# Patient Record
Sex: Female | Born: 1950 | Race: White | Hispanic: No | Smoking: Former smoker
Health system: Southern US, Community
[De-identification: ages and names within clinical notes are randomized; demographics above are authoritative.]

## PROBLEM LIST (undated history)

## (undated) DIAGNOSIS — G8929 Other chronic pain: Secondary | ICD-10-CM

## (undated) DIAGNOSIS — Z8619 Personal history of other infectious and parasitic diseases: Secondary | ICD-10-CM

## (undated) DIAGNOSIS — Z87440 Personal history of urinary (tract) infections: Secondary | ICD-10-CM

## (undated) DIAGNOSIS — F329 Major depressive disorder, single episode, unspecified: Secondary | ICD-10-CM

## (undated) DIAGNOSIS — E119 Type 2 diabetes mellitus without complications: Secondary | ICD-10-CM

## (undated) DIAGNOSIS — G4730 Sleep apnea, unspecified: Secondary | ICD-10-CM

## (undated) DIAGNOSIS — Z9289 Personal history of other medical treatment: Secondary | ICD-10-CM

## (undated) DIAGNOSIS — D649 Anemia, unspecified: Secondary | ICD-10-CM

## (undated) DIAGNOSIS — K219 Gastro-esophageal reflux disease without esophagitis: Secondary | ICD-10-CM

## (undated) DIAGNOSIS — F32A Depression, unspecified: Secondary | ICD-10-CM

## (undated) DIAGNOSIS — I10 Essential (primary) hypertension: Secondary | ICD-10-CM

## (undated) DIAGNOSIS — T7840XA Allergy, unspecified, initial encounter: Secondary | ICD-10-CM

## (undated) DIAGNOSIS — M1990 Unspecified osteoarthritis, unspecified site: Secondary | ICD-10-CM

## (undated) DIAGNOSIS — K760 Fatty (change of) liver, not elsewhere classified: Secondary | ICD-10-CM

## (undated) HISTORY — DX: Essential (primary) hypertension: I10

## (undated) HISTORY — DX: Unspecified osteoarthritis, unspecified site: M19.90

## (undated) HISTORY — DX: Other chronic pain: G89.29

## (undated) HISTORY — DX: Anemia, unspecified: D64.9

## (undated) HISTORY — DX: Type 2 diabetes mellitus without complications: E11.9

## (undated) HISTORY — DX: Personal history of other medical treatment: Z92.89

## (undated) HISTORY — DX: Major depressive disorder, single episode, unspecified: F32.9

## (undated) HISTORY — DX: Personal history of other infectious and parasitic diseases: Z86.19

## (undated) HISTORY — DX: Depression, unspecified: F32.A

## (undated) HISTORY — DX: Gastro-esophageal reflux disease without esophagitis: K21.9

## (undated) HISTORY — DX: Personal history of urinary (tract) infections: Z87.440

## (undated) HISTORY — DX: Sleep apnea, unspecified: G47.30

## (undated) HISTORY — DX: Allergy, unspecified, initial encounter: T78.40XA

## (undated) HISTORY — DX: Fatty (change of) liver, not elsewhere classified: K76.0

---

## 2001-09-26 HISTORY — PX: TOTAL HIP ARTHROPLASTY: SHX124

## 2006-01-24 ENCOUNTER — Ambulatory Visit: Payer: Self-pay | Admitting: Internal Medicine

## 2006-03-24 ENCOUNTER — Ambulatory Visit: Payer: Self-pay | Admitting: Internal Medicine

## 2007-12-06 ENCOUNTER — Ambulatory Visit: Payer: Self-pay | Admitting: Internal Medicine

## 2008-10-01 ENCOUNTER — Ambulatory Visit: Payer: Self-pay | Admitting: Internal Medicine

## 2009-02-02 ENCOUNTER — Ambulatory Visit: Payer: Self-pay | Admitting: Internal Medicine

## 2009-02-05 ENCOUNTER — Ambulatory Visit: Payer: Self-pay | Admitting: Pain Medicine

## 2009-02-11 ENCOUNTER — Ambulatory Visit: Payer: Self-pay | Admitting: Pain Medicine

## 2009-03-10 ENCOUNTER — Ambulatory Visit: Payer: Self-pay | Admitting: Pain Medicine

## 2009-04-21 ENCOUNTER — Ambulatory Visit: Payer: Self-pay | Admitting: Pain Medicine

## 2009-04-29 ENCOUNTER — Ambulatory Visit: Payer: Self-pay | Admitting: Pain Medicine

## 2009-11-04 ENCOUNTER — Emergency Department: Payer: Self-pay | Admitting: Emergency Medicine

## 2010-06-29 ENCOUNTER — Ambulatory Visit: Payer: Self-pay | Admitting: Internal Medicine

## 2010-08-10 ENCOUNTER — Ambulatory Visit: Payer: Self-pay | Admitting: Internal Medicine

## 2010-10-21 LAB — HM PAP SMEAR: HM Pap smear: NORMAL

## 2010-10-21 LAB — HM MAMMOGRAPHY: HM MAMMO: NORMAL

## 2011-03-28 ENCOUNTER — Ambulatory Visit: Payer: Self-pay | Admitting: Internal Medicine

## 2012-01-27 ENCOUNTER — Ambulatory Visit: Payer: Self-pay | Admitting: Internal Medicine

## 2012-02-25 ENCOUNTER — Ambulatory Visit: Payer: Self-pay | Admitting: Internal Medicine

## 2012-03-26 ENCOUNTER — Ambulatory Visit: Payer: Self-pay | Admitting: Internal Medicine

## 2012-04-26 ENCOUNTER — Ambulatory Visit: Payer: Self-pay | Admitting: Internal Medicine

## 2012-05-03 ENCOUNTER — Ambulatory Visit: Payer: Self-pay | Admitting: Internal Medicine

## 2012-05-27 ENCOUNTER — Ambulatory Visit: Payer: Self-pay | Admitting: Internal Medicine

## 2012-11-13 ENCOUNTER — Ambulatory Visit: Payer: Self-pay | Admitting: Internal Medicine

## 2013-03-08 ENCOUNTER — Emergency Department: Payer: Self-pay | Admitting: Emergency Medicine

## 2013-05-28 ENCOUNTER — Ambulatory Visit: Payer: Self-pay | Admitting: Adult Health

## 2013-09-02 LAB — HM DIABETES EYE EXAM

## 2013-09-10 ENCOUNTER — Ambulatory Visit: Payer: Self-pay | Admitting: Internal Medicine

## 2014-01-10 ENCOUNTER — Encounter: Payer: Self-pay | Admitting: Adult Health

## 2014-01-10 ENCOUNTER — Encounter (INDEPENDENT_AMBULATORY_CARE_PROVIDER_SITE_OTHER): Payer: Self-pay

## 2014-01-10 ENCOUNTER — Ambulatory Visit (INDEPENDENT_AMBULATORY_CARE_PROVIDER_SITE_OTHER): Payer: Medicare PPO | Admitting: Adult Health

## 2014-01-10 VITALS — BP 148/84 | HR 94 | Temp 99.1°F | Resp 14 | Ht 65.3 in

## 2014-01-10 DIAGNOSIS — IMO0002 Reserved for concepts with insufficient information to code with codable children: Secondary | ICD-10-CM

## 2014-01-10 DIAGNOSIS — G8929 Other chronic pain: Secondary | ICD-10-CM

## 2014-01-10 DIAGNOSIS — Z1239 Encounter for other screening for malignant neoplasm of breast: Secondary | ICD-10-CM

## 2014-01-10 DIAGNOSIS — IMO0001 Reserved for inherently not codable concepts without codable children: Secondary | ICD-10-CM

## 2014-01-10 DIAGNOSIS — E1165 Type 2 diabetes mellitus with hyperglycemia: Secondary | ICD-10-CM

## 2014-01-10 LAB — HEMOGLOBIN A1C: HEMOGLOBIN A1C: 9.2 % — AB (ref 4.6–6.5)

## 2014-01-10 MED ORDER — HYDROCODONE-ACETAMINOPHEN 5-325 MG PO TABS: 1.0000 | ORAL_TABLET | Freq: Two times a day (BID) | ORAL | Status: DC | PRN

## 2014-01-10 NOTE — Progress Notes (Signed)
Patient ID: Rachael JacksNancy Stokes, female   DOB: 1951/04/12, 63 y.o.   MRN: [id number]    Subjective:    Patient ID: Rachael JacksNancy Stokes, female    DOB: 1951/04/12, 63 y.o.   MRN: [id number]  HPI  Pt is a pleasant 63 y/o female with hx of severe hearing impairment who presents to clinic to establish care. She is unable to use sign language as she acquired hearing difficulty late in life and did not learn. She can read lips somewhat. She has a hearing aid in her left ear but still has significant problems understanding. Her speech is loud and somewhat distorted as a result of hearing loss. She presents to clinic to establish care. Previously followed at [facility] in [place]. Last mammogram ~ 3 years ago.  Hx of diabetes on amaryl. Could not tolerate metformin 2/2 diarrhea. She has not been exercising secondary to chronic pain. She believes that her blood glucose is not well controlled currently. Does not remember what her last A1c was.   She reports chronic left hip pain that has been ongoing since she has hip replacement surgery in 2003. She has gone back to her surgeon regarding ongoing pain but unable to determine what the cause of her pain is. She believes that it may be due to the hardware but she has not been able to have this confirmed. Pt has not had a second opinion which was recommended by her surgeon.     Past Medical History  Diagnosis Date  . Anemia   . Arthritis   . History of chicken pox   . Depression   . Diabetes mellitus without complication   . GERD (gastroesophageal reflux disease)   . Allergy   . Hypertension   . History of blood transfusion   . History of urinary tract infection   . Sleep apnea   . Fatty liver   . Chronic pain     Left hip since surgery 2003     Past Surgical History  Procedure Laterality Date  . Cesarean section    . Total hip arthroplasty Left 2003     Family History  Problem Relation Age of Onset  . Arthritis Mother   . Hyperlipidemia Mother     . Hypertension Mother   . Mental retardation Mother   . Arthritis Father   . Hyperlipidemia Father   . Hypertension Father   . Diabetes Father   . Cancer Sister     Breast cancer     History   Social History  . Marital Status: Unknown    Spouse Name: N/A    Number of Children: N/A  . Years of Education: 13   Occupational History  . LPN     Retired   Social History Main Topics  . Smoking status: Former Games developermoker  . Smokeless tobacco: Not on file  . Alcohol Use: No  . Drug Use: No  . Sexual Activity: Not on file   Other Topics Concern  . Not on file   Social History Narrative  . No narrative on file     Review of Systems  Constitutional: Negative.   HENT: Positive for hearing loss.   Eyes: Negative.   Respiratory: Negative.   Cardiovascular: Negative.   Gastrointestinal: Negative.   Endocrine: Negative.   Genitourinary: Negative.   Musculoskeletal: Negative.   Skin: Negative.   Allergic/Immunologic: Negative.   Neurological: Negative.   Hematological: Negative.   Psychiatric/Behavioral: Negative.        Objective:  BP 148/84  Pulse 94  Temp(Src) 99.1 F (37.3 C) (Oral)  Ht 5' 5.3" (1.659 m)  SpO2 94%   Physical Exam  Constitutional: She is oriented to person, place, and time. No distress.  HENT:  Head: Normocephalic and atraumatic.  Severe hearing impaired  Eyes: Conjunctivae and EOM are normal.  Neck: Normal range of motion. Neck supple.  Cardiovascular: Normal rate, regular rhythm, normal heart sounds and intact distal pulses.  Exam reveals no gallop and no friction rub.   No murmur heard. Pulmonary/Chest: Effort normal and breath sounds normal. No respiratory distress. She has no wheezes. She has no rales.  Musculoskeletal: Normal range of motion.  Neurological: She is alert and oriented to person, place, and time. She has normal reflexes. Coordination normal.  Skin: Skin is warm and dry.  Psychiatric: She has a normal mood and affect. Her  behavior is normal. Judgment and thought content normal.       Assessment & Plan:   1. Diabetes mellitus type 2, uncontrolled Check hemoglobin A1c. Currently on amaryl. Continue to follow  - Hemoglobin A1c  2. Chronic pain Refer to pain clinic. May need second opinion regarding her left hip. Hydrocodone for pain. - Ambulatory referral to Pain Clinic  3. Screening for breast cancer Last mammogram 3 years ago. Reports normal. Ordered. - MM DIGITAL SCREENING BILATERAL; Future

## 2014-01-12 ENCOUNTER — Encounter: Payer: Self-pay | Admitting: Adult Health

## 2014-01-12 DIAGNOSIS — E1165 Type 2 diabetes mellitus with hyperglycemia: Secondary | ICD-10-CM | POA: Insufficient documentation

## 2014-01-12 DIAGNOSIS — G8929 Other chronic pain: Secondary | ICD-10-CM | POA: Insufficient documentation

## 2014-01-12 DIAGNOSIS — IMO0002 Reserved for concepts with insufficient information to code with codable children: Secondary | ICD-10-CM | POA: Insufficient documentation

## 2014-01-12 DIAGNOSIS — Z1239 Encounter for other screening for malignant neoplasm of breast: Secondary | ICD-10-CM | POA: Insufficient documentation

## 2014-01-15 ENCOUNTER — Encounter: Payer: Self-pay | Admitting: Emergency Medicine

## 2014-01-15 ENCOUNTER — Telehealth: Payer: Self-pay | Admitting: Adult Health

## 2014-01-15 NOTE — Telephone Encounter (Signed)
Pt came in today wanting to add voltaren gel (diclofenac sodium topical gel) 1% to her med list She [facility]ill [facility]     She also needs to get a handicap sticker .  She gave a self address  Stamped envelope to send this to her  (in box)  She also was checking on her labs results

## 2014-01-16 NOTE — Telephone Encounter (Signed)
Ok to add medication and fill?

## 2014-01-17 ENCOUNTER — Other Ambulatory Visit: Payer: Self-pay | Admitting: *Deleted

## 2014-01-17 MED ORDER — DICLOFENAC SODIUM 1 % TD GEL: 2.0000 g | Freq: Two times a day (BID) | TRANSDERMAL | Status: DC

## 2014-01-17 NOTE — Telephone Encounter (Signed)
Ok to add to med list and refill

## 2014-01-18 ENCOUNTER — Other Ambulatory Visit: Payer: Self-pay | Admitting: Adult Health

## 2014-01-18 MED ORDER — INSULIN DETEMIR 100 UNIT/ML FLEXPEN: 10.0000 [IU] | PEN_INJECTOR | SUBCUTANEOUS | Status: DC

## 2014-01-19 ENCOUNTER — Encounter: Payer: Self-pay | Admitting: Adult Health

## 2014-01-22 ENCOUNTER — Telehealth: Payer: Self-pay

## 2014-01-22 ENCOUNTER — Emergency Department: Payer: Self-pay | Admitting: Internal Medicine

## 2014-01-22 NOTE — Telephone Encounter (Signed)
Relevant patient education mailed to patient.  

## 2014-01-23 ENCOUNTER — Encounter: Payer: Self-pay | Admitting: Adult Health

## 2014-01-23 ENCOUNTER — Ambulatory Visit (INDEPENDENT_AMBULATORY_CARE_PROVIDER_SITE_OTHER): Payer: Medicare PPO | Admitting: Adult Health

## 2014-01-23 VITALS — BP 176/90 | HR 92 | Temp 98.3°F | Resp 14

## 2014-01-23 DIAGNOSIS — S62109A Fracture of unspecified carpal bone, unspecified wrist, initial encounter for closed fracture: Secondary | ICD-10-CM

## 2014-01-23 DIAGNOSIS — IMO0001 Reserved for inherently not codable concepts without codable children: Secondary | ICD-10-CM

## 2014-01-23 DIAGNOSIS — IMO0002 Reserved for concepts with insufficient information to code with codable children: Secondary | ICD-10-CM

## 2014-01-23 DIAGNOSIS — E1165 Type 2 diabetes mellitus with hyperglycemia: Secondary | ICD-10-CM

## 2014-01-23 DIAGNOSIS — S62101A Fracture of unspecified carpal bone, right wrist, initial encounter for closed fracture: Secondary | ICD-10-CM

## 2014-01-23 MED ORDER — LINAGLIPTIN 5 MG PO TABS: 5.0000 mg | ORAL_TABLET | Freq: Every day | ORAL | Status: DC

## 2014-01-23 NOTE — Progress Notes (Signed)
Patient ID: Rachael Stokes, female   DOB: 11/25/1950, 63 y.o.   MRN: [id number]   Subjective:    Patient ID: Rachael Stokes, female    DOB: 11/25/1950, 63 y.o.   MRN: [id number]  HPI  Patient is a pleasant 63 year old female with severe hearing impairment. She is able to read lips fairly well. She has hearing aids with limited hearing. She presents to clinic to discuss recent labs which showed uncontrolled diabetes. Hemoglobin A1c is 9.2%. I had called in a prescription to her pharmacy for basal insulin to add to sulfonylurea. She became very nervous and stated she did not want to become insulin-dependent. It was very difficult to communicate with her via telephone even though she has a special phone service for the hearing impaired. She made an appointment to discuss medications and her recent labs.  Also, patient fell in front of [employer] 2 days ago and fractured her left wrist. She also has a hematoma below her right eye. She was seen in the emergency room. She has a soft cast in place and was instructed to followup with Dr. Ernest Stokes. Patient reports that she was in significant amount of pain. I had given her a prescription for Norco during her previous visit for hip pain. She would like to know if it was okay for her to take this medication for her arm pain even though I prescribed it for her hip.   She is pretty disappointed with her recent fall and fracture. She had just started back to exercise in the pool. Now she cannot get the cast wet and it is setting her back with exercise.   Past Medical History  Diagnosis Date  . Anemia   . Arthritis   . History of chicken pox   . Depression   . Diabetes mellitus without complication   . GERD (gastroesophageal reflux disease)   . Allergy   . Hypertension   . History of blood transfusion   . History of urinary tract infection   . Sleep apnea   . Fatty liver     u/s 08/2013  . Chronic pain     Left hip since surgery 2003   Past Surgical History    Procedure Laterality Date  . Cesarean section    . Total hip arthroplasty Left 2003   Family History  Problem Relation Age of Onset  . Arthritis Mother   . Hyperlipidemia Mother   . Hypertension Mother   . Mental retardation Mother   . Arthritis Father   . Hyperlipidemia Father   . Hypertension Father   . Diabetes Father   . Cancer Sister     Breast cancer   History   Social History  . Marital Status: Unknown    Spouse Name: N/A    Number of Children: N/A  . Years of Education: 13   Occupational History  . LPN     Retired   Social History Main Topics  . Smoking status: Former Games developermoker  . Smokeless tobacco: Not on file  . Alcohol Use: No  . Drug Use: No  . Sexual Activity: Not on file   Other Topics Concern  . Not on file   Social History Narrative  . No narrative on file     Current Outpatient Prescriptions on File Prior to Visit  Medication Sig Dispense Refill  . acetaminophen (TYLENOL) 650 MG CR tablet Take 650 mg by mouth every 8 (eight) hours as needed for pain.      .Marland Kitchen  albuterol (PROVENTIL HFA;VENTOLIN HFA) 108 (90 BASE) MCG/ACT inhaler Inhale 2 puffs into the lungs every 6 (six) hours as needed for wheezing or shortness of breath.      Marland Kitchen. aspirin 81 MG tablet Take 81 mg by mouth daily.      . diclofenac sodium (VOLTAREN) 1 % GEL Apply 2 g topically 2 (two) times daily.  100 g  5  . EPINEPHrine (EPI-PEN) 0.3 mg/0.3 mL SOAJ injection Inject 0.3 mg into the muscle once.      Marland Kitchen. FLUTICASONE PROPIONATE  HFA IN Inhale 50 mcg into the lungs 2 (two) times daily.      Marland Kitchen. glimepiride (AMARYL) 4 MG tablet Take 4 mg by mouth. One tablet 2 times a day      . hydrochlorothiazide (HYDRODIURIL) 25 MG tablet Take 25 mg by mouth. Take 1 tablet daily      . HYDROcodone-acetaminophen (NORCO/VICODIN) 5-325 MG per tablet Take 1 tablet by mouth every 12 (twelve) hours as needed.  60 tablet  0  . ipratropium-albuterol (DUONEB) 0.5-2.5 (3) MG/3ML SOLN Take 3 mLs by nebulization every  4 (four) hours as needed.      . ranitidine (ZANTAC) 150 MG tablet Take 150 mg by mouth 2 (two) times daily.      Marland Kitchen. SALINE NASAL SPRAY NA Place into the nose.      . zolpidem (AMBIEN) 10 MG tablet Take 10 mg by mouth. Take 1 tablet by mouth at bedtime as needed       No current facility-administered medications on file prior to visit.     Review of Systems  Constitutional: Negative.   HENT: Negative.   Eyes: Negative.   Respiratory: Negative.   Cardiovascular: Negative.   Gastrointestinal: Negative.   Endocrine: Negative.   Genitourinary: Negative.   Musculoskeletal: Positive for arthralgias (pain in left hip.).       Right wrist fracture  Skin: Negative.   Allergic/Immunologic: Negative.   Neurological: Negative.   Hematological: Negative.   Psychiatric/Behavioral: Negative.        Objective:  BP 176/90  Pulse 92  Temp(Src) 98.3 F (36.8 C) (Oral)  Resp 14  SpO2 96%   Physical Exam  Constitutional: She is oriented to person, place, and time. No distress.  HENT:  Head: Normocephalic and atraumatic.  Eyes: Conjunctivae and EOM are normal.  Neck: Normal range of motion. Neck supple.  Cardiovascular: Normal rate and regular rhythm.   Pulmonary/Chest: Effort normal. No respiratory distress.  Musculoskeletal:  Right arm in soft cast. Fracture wrist following fall. Difficulty getting from sitting to standing position 2/2 hip pain  Neurological: She is alert and oriented to person, place, and time. She has normal reflexes. Coordination normal.  Skin: Skin is warm and dry.  Psychiatric: She has a normal mood and affect. Her behavior is normal. Judgment and thought content normal.      Assessment & Plan:   1. Right wrist fracture She will schedule appointment with Dr. Ernest Stokes regarding her fracture. She would like to get his opinion regarding her ongoing problems with her hip. If she needs a referral for this I will be glad to submit one.  2. Diabetes mellitus type 2,  uncontrolled We will discontinue the levemir 2/2 her stress about insulin. Also, now with right hand incapacitated she will not be able to give herself injections. Start Tradjenta 5 mg daily. Continue amaryl bid. Follow up in 6 weeks to monitor progress. If HgbA1c not down we will start insulin. Pt  agreeable.

## 2014-01-23 NOTE — Progress Notes (Signed)
Pre visit review using our clinic review tool, if applicable. No additional management support is needed unless otherwise documented below in the visit note. 

## 2014-01-23 NOTE — Patient Instructions (Signed)
  Start taking tradjenta 5 mg daily. This is a new diabetic medication for you.  Continue the Amaryl twice a day.  Return in 6 weeks to recheck your sugar.

## 2014-01-23 NOTE — Telephone Encounter (Signed)
Pt asked for her handicap sticker at checkout.  She dropped it of with self-addressed stamped envelope.  Would like this mailed to her.

## 2014-01-24 NOTE — Telephone Encounter (Signed)
Mailed form to patient.

## 2014-02-10 ENCOUNTER — Other Ambulatory Visit: Payer: Self-pay | Admitting: Adult Health

## 2014-02-10 ENCOUNTER — Telehealth: Payer: Self-pay | Admitting: Adult Health

## 2014-02-10 MED ORDER — HYDROCODONE-ACETAMINOPHEN 5-325 MG PO TABS: 1.0000 | ORAL_TABLET | Freq: Two times a day (BID) | ORAL | Status: DC | PRN

## 2014-02-10 NOTE — Telephone Encounter (Signed)
I am refilling medication. Please let pt know that she needs to give us at least 3 day notice for refills.

## 2014-02-10 NOTE — Telephone Encounter (Signed)
Patient here to pick up Rx and I notified her that she has to give us 3 day notice for all refills. Patient verbalized understanding.

## 2014-02-10 NOTE — Telephone Encounter (Signed)
Needing prescription today.  HYDROcodone-acetaminophen (NORCO/VICODIN) 5-325 MG per

## 2014-02-12 ENCOUNTER — Encounter: Payer: Self-pay | Admitting: Adult Health

## 2014-02-12 ENCOUNTER — Ambulatory Visit (INDEPENDENT_AMBULATORY_CARE_PROVIDER_SITE_OTHER): Payer: Medicare PPO | Admitting: Adult Health

## 2014-02-12 VITALS — BP 141/88 | HR 104 | Temp 100.3°F | Resp 16

## 2014-02-12 DIAGNOSIS — R509 Fever, unspecified: Secondary | ICD-10-CM

## 2014-02-12 LAB — COMPREHENSIVE METABOLIC PANEL
ALK PHOS: 105 U/L (ref 39–117)
ALT: 74 U/L — AB (ref 0–35)
AST: 66 U/L — ABNORMAL HIGH (ref 0–37)
Albumin: 4 g/dL (ref 3.5–5.2)
BILIRUBIN TOTAL: 0.6 mg/dL (ref 0.2–1.2)
BUN: 15 mg/dL (ref 6–23)
CO2: 28 meq/L (ref 19–32)
CREATININE: 0.8 mg/dL (ref 0.4–1.2)
Calcium: 9.2 mg/dL (ref 8.4–10.5)
Chloride: 90 mEq/L — ABNORMAL LOW (ref 96–112)
GFR: 76.57 mL/min (ref >60.00)
Glucose, Bld: 227 mg/dL — ABNORMAL HIGH (ref 70–99)
Potassium: 4.2 mEq/L (ref 3.5–5.1)
Sodium: 131 mEq/L — ABNORMAL LOW (ref 135–145)
Total Protein: 7.7 g/dL (ref 6.0–8.3)

## 2014-02-12 LAB — CBC WITH DIFFERENTIAL/PLATELET
Basophils Absolute: 0 10*3/uL (ref 0.0–0.1)
Basophils Relative: 0 % (ref 0.0–3.0)
Eosinophils Absolute: 0 10*3/uL (ref 0.0–0.7)
Eosinophils Relative: 0.1 % (ref 0.0–5.0)
HEMATOCRIT: 49.9 % — AB (ref 36.0–46.0)
Hemoglobin: 16.9 g/dL — ABNORMAL HIGH (ref 12.0–15.0)
Lymphocytes Relative: 10.5 % — ABNORMAL LOW (ref 12.0–46.0)
Lymphs Abs: 0.9 10*3/uL (ref 0.7–4.0)
MCHC: 33.8 g/dL (ref 30.0–36.0)
MCV: 86.6 fl (ref 78.0–100.0)
MONOS PCT: 6.8 % (ref 3.0–12.0)
Monocytes Absolute: 0.6 10*3/uL (ref 0.1–1.0)
NEUTROS ABS: 7 10*3/uL (ref 1.4–7.7)
Neutrophils Relative %: 82.6 % — ABNORMAL HIGH (ref 43.0–77.0)
Platelets: 188 10*3/uL (ref 150.0–400.0)
RBC: 5.76 Mil/uL — ABNORMAL HIGH (ref 3.87–5.11)
RDW: 13.8 % (ref 11.5–15.5)
WBC: 8.4 10*3/uL (ref 4.0–10.5)

## 2014-02-12 LAB — POCT INFLUENZA A/B
INFLUENZA A, POC: NEGATIVE
INFLUENZA B, POC: NEGATIVE

## 2014-02-12 LAB — POCT URINALYSIS DIPSTICK
Glucose, UA: NEGATIVE
KETONES UA: NEGATIVE
LEUKOCYTES UA: NEGATIVE
NITRITE UA: NEGATIVE
PH UA: 5.5
PROTEIN UA: 300
Spec Grav, UA: >=1.03
UROBILINOGEN UA: 0.2

## 2014-02-12 MED ORDER — PROMETHAZINE HCL 25 MG PO TABS: 25.0000 mg | ORAL_TABLET | Freq: Three times a day (TID) | ORAL | Status: DC | PRN

## 2014-02-12 NOTE — Patient Instructions (Signed)
  Hold your blood sugar medication until you are able to eat and keep down food.  Clear liquids for the next 24 hours:   Chicken broth  Jello  Sprite  Tea  Ginger Ale  After the 24 hours you may advance your diet to bland, easy to digest food:   Toast  Rice  Plain potato  Phenergan for nausea every 8 hours as needed for nausea  Tylenol for fever and general discomfort  If you are not any better by Friday morning please call the office.

## 2014-02-12 NOTE — Progress Notes (Signed)
Patient ID: Rachael Stokes, female   DOB: 05/14/1951, 63 y.o.   MRN: [id number]   Subjective:    Patient ID: Rachael Stokes, female    DOB: 05/14/1951, 63 y.o.   MRN: [id number]  HPI  Pt is a pleasant 63 y/o female who walked in to clinic today requesting appointment for nausea, vomiting, fever. She was worked in at the end of the morning. Pt's symptoms began Monday night. She reports feeling fine during the day. Reports fever of 102 for which she took tylenol. Her temp has subsided and now is low grade. Still very nauseated. Has not been able to keep anything down. Pt is a diabetic. Blood glucose levels have not been low despite her continuing to take her medication.   Past Medical History  Diagnosis Date  . Anemia   . Arthritis   . History of chicken pox   . Depression   . Diabetes mellitus without complication   . GERD (gastroesophageal reflux disease)   . Allergy   . Hypertension   . History of blood transfusion   . History of urinary tract infection   . Sleep apnea   . Fatty liver     u/s 08/2013  . Chronic pain     Left hip since surgery 2003    Current Outpatient Prescriptions on File Prior to Visit  Medication Sig Dispense Refill  . acetaminophen (TYLENOL) 650 MG CR tablet Take 650 mg by mouth every 8 (eight) hours as needed for pain.      Marland Kitchen. albuterol (PROVENTIL HFA;VENTOLIN HFA) 108 (90 BASE) MCG/ACT inhaler Inhale 2 puffs into the lungs every 6 (six) hours as needed for wheezing or shortness of breath.      Marland Kitchen. aspirin 81 MG tablet Take 81 mg by mouth daily.      . diclofenac sodium (VOLTAREN) 1 % GEL Apply 2 g topically 2 (two) times daily.  100 g  5  . EPINEPHrine (EPI-PEN) 0.3 mg/0.3 mL SOAJ injection Inject 0.3 mg into the muscle once.      Marland Kitchen. FLUTICASONE PROPIONATE  HFA IN Inhale 50 mcg into the lungs 2 (two) times daily.      Marland Kitchen. glimepiride (AMARYL) 4 MG tablet Take 4 mg by mouth. One tablet 2 times a day      . hydrochlorothiazide (HYDRODIURIL) 25 MG tablet Take 25 mg by  mouth. Take 1 tablet daily      . HYDROcodone-acetaminophen (NORCO/VICODIN) 5-325 MG per tablet Take 1 tablet by mouth every 12 (twelve) hours as needed.  60 tablet  0  . ipratropium-albuterol (DUONEB) 0.5-2.5 (3) MG/3ML SOLN Take 3 mLs by nebulization every 4 (four) hours as needed.      . linagliptin (TRADJENTA) 5 MG TABS tablet Take 1 tablet (5 mg total) by mouth daily.  30 tablet  2  . ranitidine (ZANTAC) 150 MG tablet Take 150 mg by mouth 2 (two) times daily.      Marland Kitchen. SALINE NASAL SPRAY NA Place into the nose.      . zolpidem (AMBIEN) 10 MG tablet Take 10 mg by mouth. Take 1 tablet by mouth at bedtime as needed       No current facility-administered medications on file prior to visit.     Review of Systems  Constitutional: Positive for fever, chills and appetite change.  Respiratory: Negative.   Cardiovascular: Negative.   Gastrointestinal: Positive for nausea and vomiting. Negative for abdominal pain, diarrhea and blood in stool.  Neurological: Positive for headaches. Negative  for dizziness and light-headedness.       Severe HOH.   Psychiatric/Behavioral: Negative.   All other systems reviewed and are negative.      Objective:  BP 141/88  Pulse 104  Temp(Src) 100.3 F (37.9 C) (Oral)  Resp 16  SpO2 94%   Physical Exam  Constitutional: She is oriented to person, place, and time. No distress.  HENT:  Head: Normocephalic and atraumatic.  Eyes: Conjunctivae and EOM are normal.  Neck: Normal range of motion. Neck supple.  Cardiovascular: Normal rate, regular rhythm, normal heart sounds and intact distal pulses.  Exam reveals no gallop and no friction rub.   No murmur heard. Pulmonary/Chest: Effort normal and breath sounds normal. No respiratory distress. She has no wheezes. She has no rales.  Musculoskeletal: Normal range of motion.  Neurological: She is alert and oriented to person, place, and time. She has normal reflexes. Coordination normal.  Skin: Skin is warm and dry.    Psychiatric: She has a normal mood and affect. Her behavior is normal. Judgment and thought content normal.      Assessment & Plan:   1. Fever, unspecified Influenza negative. Check labs. UA shows very concentrated urine. No nitrites or leukocytes. Send for culture. Clear liquids for 24 hours. Hold diabetic medications until tolerating PO. Advance diet as tolerated after 24 hours. Phenergan for nausea. RTC if no improvement by Friday morning.  - POCT Influenza A/B - POCT urinalysis dipstick - Comprehensive metabolic panel - CBC with Differential

## 2014-02-16 LAB — URINE CULTURE: Colony Count: >=100000

## 2014-02-17 ENCOUNTER — Other Ambulatory Visit: Payer: Self-pay | Admitting: Adult Health

## 2014-02-17 MED ORDER — CIPROFLOXACIN HCL 250 MG PO TABS: 250.0000 mg | ORAL_TABLET | Freq: Two times a day (BID) | ORAL | Status: DC

## 2014-02-21 ENCOUNTER — Encounter: Payer: Self-pay | Admitting: Adult Health

## 2014-03-07 ENCOUNTER — Encounter: Payer: Self-pay | Admitting: *Deleted

## 2014-03-07 ENCOUNTER — Other Ambulatory Visit: Payer: Medicare PPO

## 2014-03-19 ENCOUNTER — Ambulatory Visit: Payer: Medicare PPO | Admitting: Adult Health

## 2014-03-27 ENCOUNTER — Other Ambulatory Visit (INDEPENDENT_AMBULATORY_CARE_PROVIDER_SITE_OTHER): Payer: Medicare PPO

## 2014-03-27 ENCOUNTER — Other Ambulatory Visit: Payer: Self-pay | Admitting: Adult Health

## 2014-03-27 DIAGNOSIS — IMO0002 Reserved for concepts with insufficient information to code with codable children: Secondary | ICD-10-CM

## 2014-03-27 DIAGNOSIS — E1165 Type 2 diabetes mellitus with hyperglycemia: Secondary | ICD-10-CM

## 2014-03-27 DIAGNOSIS — IMO0001 Reserved for inherently not codable concepts without codable children: Secondary | ICD-10-CM

## 2014-03-27 LAB — HEMOGLOBIN A1C: HEMOGLOBIN A1C: 9.3 % — AB (ref 4.6–6.5)

## 2014-04-01 ENCOUNTER — Ambulatory Visit (INDEPENDENT_AMBULATORY_CARE_PROVIDER_SITE_OTHER): Payer: Medicare PPO | Admitting: Adult Health

## 2014-04-01 ENCOUNTER — Encounter: Payer: Self-pay | Admitting: Adult Health

## 2014-04-01 VITALS — BP 132/84 | HR 92 | Temp 99.0°F | Resp 14

## 2014-04-01 DIAGNOSIS — E1165 Type 2 diabetes mellitus with hyperglycemia: Secondary | ICD-10-CM

## 2014-04-01 DIAGNOSIS — G894 Chronic pain syndrome: Secondary | ICD-10-CM

## 2014-04-01 DIAGNOSIS — IMO0001 Reserved for inherently not codable concepts without codable children: Secondary | ICD-10-CM

## 2014-04-01 DIAGNOSIS — Z23 Encounter for immunization: Secondary | ICD-10-CM

## 2014-04-01 DIAGNOSIS — IMO0002 Reserved for concepts with insufficient information to code with codable children: Secondary | ICD-10-CM

## 2014-04-01 MED ORDER — METFORMIN HCL ER 500 MG PO TB24: 500.0000 mg | ORAL_TABLET | Freq: Every day | ORAL | Status: DC

## 2014-04-01 MED ORDER — ZOSTER VACCINE LIVE 19400 UNT/0.65ML ~~LOC~~ SOLR: 0.6500 mL | Freq: Once | SUBCUTANEOUS | Status: DC

## 2014-04-01 MED ORDER — HYDROCODONE-ACETAMINOPHEN 5-325 MG PO TABS: 1.0000 | ORAL_TABLET | Freq: Two times a day (BID) | ORAL | Status: DC | PRN

## 2014-04-01 NOTE — Progress Notes (Signed)
Pre visit review using our clinic review tool, if applicable. No additional management support is needed unless otherwise documented below in the visit note. 

## 2014-04-01 NOTE — Progress Notes (Signed)
Subjective:    Patient ID: Rachael JacksNancy Stokes, female    DOB: June 05, 1951, 63 y.o.   MRN: [id number]  HPI  63 yo female presents today for follow up on uncontrolled diabetes. Recent HgbA1c was 9.3 up from 9.2 from the previous lab. Currently taking the Amaryl and Tradjenta. Is addament about not taking insulin. Indicates she tries to eat healthy and she likes to cook. But then also reports having some dietary indiscretions with bread and potatoes.   Had a recent fall which has exacerbated the pain in her back and her hip. Has been using the Voltaren Gel and the Vicodin. Was previously referred to the pain clinic. Had difficulty finding the pain clinic and requested an alternative clinic.  Eye exam: [facility] Janet BerlinMichael Tanner [facility] every December for Diabetic eye exam.    Past Medical History  Diagnosis Date  . Anemia   . Arthritis   . History of chicken pox   . Depression   . Diabetes mellitus without complication   . GERD (gastroesophageal reflux disease)   . Allergy   . Hypertension   . History of blood transfusion   . History of urinary tract infection   . Sleep apnea   . Fatty liver     u/s 08/2013  . Chronic pain     Left hip since surgery 2003    Current Outpatient Prescriptions on File Prior to Visit  Medication Sig Dispense Refill  . acetaminophen (TYLENOL) 650 MG CR tablet Take 650 mg by mouth every 8 (eight) hours as needed for pain.      Marland Kitchen. albuterol (PROVENTIL HFA;VENTOLIN HFA) 108 (90 BASE) MCG/ACT inhaler Inhale 2 puffs into the lungs every 6 (six) hours as needed for wheezing or shortness of breath.      Marland Kitchen. aspirin 81 MG tablet Take 81 mg by mouth daily.      . ciprofloxacin (CIPRO) 250 MG tablet Take 1 tablet (250 mg total) by mouth 2 (two) times daily.  10 tablet  0  . diclofenac sodium (VOLTAREN) 1 % GEL Apply 2 g topically 2 (two) times daily.  100 g  5  . EPINEPHrine (EPI-PEN) 0.3 mg/0.3 mL SOAJ injection Inject 0.3 mg into the muscle once.        Marland Kitchen. FLUTICASONE PROPIONATE  HFA IN Inhale 50 mcg into the lungs 2 (two) times daily.      Marland Kitchen. glimepiride (AMARYL) 4 MG tablet Take 4 mg by mouth. One tablet 2 times a day      . hydrochlorothiazide (HYDRODIURIL) 25 MG tablet Take 25 mg by mouth. Take 1 tablet daily      . HYDROcodone-acetaminophen (NORCO/VICODIN) 5-325 MG per tablet Take 1 tablet by mouth every 12 (twelve) hours as needed.  60 tablet  0  . ipratropium-albuterol (DUONEB) 0.5-2.5 (3) MG/3ML SOLN Take 3 mLs by nebulization every 4 (four) hours as needed.      . linagliptin (TRADJENTA) 5 MG TABS tablet Take 1 tablet (5 mg total) by mouth daily.  30 tablet  2  . promethazine (PHENERGAN) 25 MG tablet Take 1 tablet (25 mg total) by mouth every 8 (eight) hours as needed for nausea or vomiting.  30 tablet  0  . ranitidine (ZANTAC) 150 MG tablet Take 150 mg by mouth 2 (two) times daily.      Marland Kitchen. SALINE NASAL SPRAY NA Place into the nose.      . zolpidem (AMBIEN) 10 MG tablet Take 10 mg by mouth. Take  1 tablet by mouth at bedtime as needed       No current facility-administered medications on file prior to visit.     Review of Systems  Constitutional: Negative.   HENT: Positive for postnasal drip.   Eyes: Negative.   Respiratory: Negative.   Cardiovascular: Negative.   Gastrointestinal: Negative.   Endocrine: Negative.   Genitourinary: Negative.   Musculoskeletal: Positive for arthralgias (Hip pain) and back pain.  Skin: Negative.   Neurological: Negative.   Hematological: Negative.   Psychiatric/Behavioral: Negative.        Objective:  BP 132/84  Pulse 92  Temp(Src) 99 F (37.2 C) (Oral)  Resp 14  SpO2 97%   Physical Exam  Constitutional: She is oriented to person, place, and time. No distress.  HENT:  Head: Normocephalic.  Cardiovascular: Normal rate, regular rhythm and normal heart sounds.   Pulmonary/Chest: Effort normal and breath sounds normal.  Neurological: She is alert and oriented to person, place, and time.   Skin: Skin is warm and dry.  Psychiatric: She has a normal mood and affect. Her behavior is normal. Judgment and thought content normal.      Assessment & Plan:  1. Diabetes mellitus type 2, uncontrolled A1c continues to remain high. Pt addament about not taking insulin. Continue Tradjenta and Amaryl as prescribed. Will attempt Metformin XR with patient agreement. Obtain microalbumin for kidney function. Next eye exam in December.   - Urine Microalbumin w/creat. ratio; Future  2. Chronic pain syndrome Continues to have chronic pain which has been exacerbated by a fall. Refill Voltaren Gel and Vicodin. [facility].  - Ambulatory referral to Pain Clinic  3. Need for shingles vaccine Deficient in shingles vaccine per recommendations. Order sent to pharmacy for administration.  - zoster vaccine live, PF, (ZOSTAVAX) [id number] UNT/0.65ML injection; Inject 19,400 Units into the skin once.  Dispense: 1 each; Refill: 0  NOTE: Spent >25 minutes in face-to-face communication with patient secondary to hearing difficulties. Communication requiring writing for adequate understanding. All patient questions were answered.

## 2014-04-02 ENCOUNTER — Telehealth: Payer: Self-pay | Admitting: Adult Health

## 2014-04-02 ENCOUNTER — Encounter: Payer: Self-pay | Admitting: Adult Health

## 2014-04-02 LAB — MICROALBUMIN / CREATININE URINE RATIO
Creatinine,U: 185.1 mg/dL
Microalb Creat Ratio: 47.7 mg/g — ABNORMAL HIGH (ref 0.0–30.0)
Microalb, Ur: 88.2 mg/dL — ABNORMAL HIGH (ref 0.0–1.9)

## 2014-04-02 NOTE — Telephone Encounter (Signed)
Pt need hydroncodone dx to be written per pharmancy. And the rx for Voltran Gel renewed. Please mail to pt/msn

## 2014-04-08 NOTE — Telephone Encounter (Signed)
Patient called stating she needs dx written on her hydrocodone Rx per pharmacy. Is it ok to re-print Rx with pain as Dx or should I call the pharmacy and give them a verbal? Patient also requesting a refill on voltran gel, is ok to refill?

## 2014-04-08 NOTE — Telephone Encounter (Signed)
You can give the pharmacy a verbal. Why are they needing a dx? Ok to send in voltaren gel refill

## 2014-04-09 ENCOUNTER — Telehealth: Payer: Self-pay | Admitting: Internal Medicine

## 2014-04-09 ENCOUNTER — Telehealth: Payer: Self-pay | Admitting: Adult Health

## 2014-04-09 MED ORDER — HYDROCODONE-ACETAMINOPHEN 5-325 MG PO TABS: 1.0000 | ORAL_TABLET | Freq: Two times a day (BID) | ORAL | Status: DC | PRN

## 2014-04-09 NOTE — Telephone Encounter (Signed)
Pt called with 2 concerns: She stated she had told someone in the office to mail her hydrocodone Rx to the pharmacy last week after her visit. The Rx was printed but she states she was not given the Rx at her visit. She states the pharmacy has not received. I did call pharmacy to confirm that they have not received a Rx in the last week. I advised pt that [place] and Raquel were both out of the office today, to confirm if one of them had mailed the Rx, but that Scarlette Sliceonika would be in tomorrow. She states that the office should have better "continuity" for when these things happen, why would it not be documented that it was mailed? How was someone to know if it was actually mailed and why does the pharmacy not have it. I offered to ask another physician to approve the refill while Raquel was out of the office. Pt then said, "Can Ms. Rey not sign her own prescriptions?" I again told patient that [facility]quel is currently out of the office but would be happy to send the message on to another provider. Pt states she would be by at 10:00 am tomorrow morning to pick up the Rx. I recommended to have her wait until I called her and told her one was ready before coming to the office to save her a trip in case it wasn't ready. She said she has difficulties receiving calls due to her hearing problems. I asked her if she would prefer to call me tomorrow to confirm a Rx is ready. She then got upset and said it is hard to reach a person in the office and I'm the 4th person she had talked to and she keeps getting the run around and no one can ever help her. Offered again to send the Rx to another physician, pt hung up the phone before I received an answer.  She also was concerned with the fact that she had not been contacted about her pain clinic referral. Advised her that I saw in referral note that she had an appt with them that had been scheduled 04/07/14. She then stated she knew that, she was unable to locate their office. States she  has spoken to our office and no one was able to help her with an address or phone number. I offered to give her the address and phone number so that she could call and reschedule her appointment with them. She jumped back and forth between the 2 concerns during the conversation, raising her voice and cursing one time. She hung up the phone before I could give her the contact information to the pain clinic like she requested.   Can another Rx for the hydrocodone be printed and signed?

## 2014-04-09 NOTE — Telephone Encounter (Signed)
Ok to refill,  printed rx  

## 2014-04-09 NOTE — Telephone Encounter (Signed)
Pt called about not receiving  rx that she had requested. Pt stated " what the hell are you going to do, sent me the script or not. I'm not going to keep going all around the damn place." pt was transferred to the triage nurse. Pt was very angry and cursed when trying to get information from her/msn

## 2014-04-09 NOTE — Telephone Encounter (Signed)
See other telephone note from Dr. Darrick Huntsmanullo: She will not refill Rx, refill must come from [facility].

## 2014-04-09 NOTE — Telephone Encounter (Signed)
After hearing more information from staff,, I will NOT refill this patient's prescriptions.  It must come from Raquel

## 2014-04-10 ENCOUNTER — Encounter: Payer: Self-pay | Admitting: *Deleted

## 2014-04-12 NOTE — Telephone Encounter (Signed)
Did pt get prescription? If not, I will sign on Monday

## 2014-04-12 NOTE — Telephone Encounter (Signed)
Please initiate discharge from practice. I will not tolerate rudeness or profanity towards our staff.

## 2014-04-14 MED ORDER — HYDROCODONE-ACETAMINOPHEN 5-325 MG PO TABS: 1.0000 | ORAL_TABLET | Freq: Two times a day (BID) | ORAL | Status: DC | PRN

## 2014-04-14 NOTE — Telephone Encounter (Signed)
Rx printed for signature. 

## 2014-04-14 NOTE — Telephone Encounter (Signed)
Pt has appt tomorrow, will give to her then.

## 2014-04-15 ENCOUNTER — Encounter: Payer: Self-pay | Admitting: Adult Health

## 2014-04-15 ENCOUNTER — Ambulatory Visit (INDEPENDENT_AMBULATORY_CARE_PROVIDER_SITE_OTHER): Payer: Medicare PPO | Admitting: Adult Health

## 2014-04-15 VITALS — BP 152/90 | HR 90 | Resp 16

## 2014-04-15 DIAGNOSIS — IMO0001 Reserved for inherently not codable concepts without codable children: Secondary | ICD-10-CM

## 2014-04-15 DIAGNOSIS — IMO0002 Reserved for concepts with insufficient information to code with codable children: Secondary | ICD-10-CM

## 2014-04-15 DIAGNOSIS — E1165 Type 2 diabetes mellitus with hyperglycemia: Secondary | ICD-10-CM

## 2014-04-15 DIAGNOSIS — G8929 Other chronic pain: Secondary | ICD-10-CM

## 2014-04-15 MED ORDER — LINAGLIPTIN 5 MG PO TABS: 5.0000 mg | ORAL_TABLET | Freq: Every day | ORAL | Status: DC

## 2014-04-15 MED ORDER — GLIMEPIRIDE 4 MG PO TABS: ORAL_TABLET | ORAL | Status: DC

## 2014-04-15 NOTE — Progress Notes (Signed)
Patient ID: Rachael Stokes, female   DOB: Jul 17, 1951, 63 y.o.   MRN: [id number]   Subjective:    Patient ID: Rachael Stokes, female    DOB: Aug 25, 1951, 63 y.o.   MRN: [id number]  HPI  Pt is a 63 y/o severely hearing impaired female who presents for follow up:  1. Diabetes mellitus type 2, uncontrolled We tried to add metformin XR to see if she would tolerate better. She has been experiencing significant cramping, diarrhea. She stopped taking the metformin. She continues with amaryl and tradjenta. No documented hypoglycemic events.  2. Chronic pain Hx of chronic left hip pain that has been ongoing since she has hip replacement surgery in 2003. She has gone back to her surgeon regarding ongoing pain but unable to determine what the cause of her pain is. She believes that it may be due to the hardware but she has not been able to have this confirmed. She takes hydrocodone 1-2 tablets daily to manage her pain. I have referred her to pain management; however, because of her hearing impairment, it has been difficult to communicate appointment specifics via telephone. She has seen Dr. Welton Flakes for pain management in the past and dose not wish to be seen by him again. She is agreeable to [facility] or [facility] in [place]. She does water exercises which she feels helps her overall pain. She is compliant with medication and takes appropriately.   Past Medical History  Diagnosis Date  . Anemia   . Arthritis   . History of chicken pox   . Depression   . Diabetes mellitus without complication   . GERD (gastroesophageal reflux disease)   . Allergy   . Hypertension   . History of blood transfusion   . History of urinary tract infection   . Sleep apnea   . Fatty liver     u/s 08/2013  . Chronic pain     Left hip since surgery 2003    Current Outpatient Prescriptions on File Prior to Visit  Medication Sig Dispense Refill  . acetaminophen (TYLENOL) 650 MG CR tablet Take 650 mg by mouth  every 8 (eight) hours as needed for pain.      Marland Kitchen albuterol (PROVENTIL HFA;VENTOLIN HFA) 108 (90 BASE) MCG/ACT inhaler Inhale 2 puffs into the lungs every 6 (six) hours as needed for wheezing or shortness of breath.      Marland Kitchen aspirin 81 MG tablet Take 81 mg by mouth daily.      . diclofenac sodium (VOLTAREN) 1 % GEL Apply 2 g topically 2 (two) times daily.  100 g  5  . EPINEPHrine (EPI-PEN) 0.3 mg/0.3 mL SOAJ injection Inject 0.3 mg into the muscle once.      Marland Kitchen FLUTICASONE PROPIONATE  HFA IN Inhale 50 mcg into the lungs 2 (two) times daily.      . hydrochlorothiazide (HYDRODIURIL) 25 MG tablet Take 25 mg by mouth. Take 1 tablet daily      . HYDROcodone-acetaminophen (NORCO/VICODIN) 5-325 MG per tablet Take 1 tablet by mouth every 12 (twelve) hours as needed.  60 tablet  0  . ipratropium-albuterol (DUONEB) 0.5-2.5 (3) MG/3ML SOLN Take 3 mLs by nebulization every 4 (four) hours as needed.      . promethazine (PHENERGAN) 25 MG tablet Take 1 tablet (25 mg total) by mouth every 8 (eight) hours as needed for nausea or vomiting.  30 tablet  0  . ranitidine (ZANTAC) 150 MG tablet Take 150 mg by mouth 2 (  two) times daily as needed.       Marland Kitchen. SALINE NASAL SPRAY NA Place into the nose.      . zolpidem (AMBIEN) 10 MG tablet Take 10 mg by mouth. Take 1 tablet by mouth at bedtime as needed      . zoster vaccine live, PF, (ZOSTAVAX) [id number] UNT/0.65ML injection Inject 19,400 Units into the skin once.  1 each  0   No current facility-administered medications on file prior to visit.     Review of Systems  Constitutional: Negative.   HENT: Positive for hearing loss (significant hearing loss. ).   Eyes: Negative.   Respiratory: Negative.   Cardiovascular: Negative.   Gastrointestinal: Positive for abdominal pain (cramping from metforming).  Endocrine: Negative.   Genitourinary: Negative.   Musculoskeletal: Positive for arthralgias.       Hip pain  Skin: Negative.   Allergic/Immunologic: Negative.     Neurological: Negative.   Hematological: Negative.   Psychiatric/Behavioral: Negative.        Objective:  BP 152/90  Pulse 90  Resp 16  SpO2 95%   Physical Exam  Constitutional: She is oriented to person, place, and time. No distress.  Cardiovascular: Normal rate and regular rhythm.   Pulmonary/Chest: Effort normal. No respiratory distress.  Musculoskeletal: Normal range of motion.  Neurological: She is alert and oriented to person, place, and time.  Skin: Skin is warm and dry.  Psychiatric: She has a normal mood and affect. Her behavior is normal. Judgment and thought content normal.       Assessment & Plan:   1. Diabetes mellitus type 2, uncontrolled Discontinued metformin 2/2 intolerance. Continue amaryl and tradjenta. RTC in 3 months for A1c and DM follow up. She will continue with exercises and watching diet.  2. Chronic pain Continue hydrocodone 1-2 tablets daily as needed for pain. Referral to pain clinic for evaluation and recommendations/management.  Note, greater than 30 min spent in face to face communication in the education, planning, implementation of care pertaining to these problems. Pt is severely hearing impaired and requires writing everything down for her to understand.

## 2014-04-15 NOTE — Patient Instructions (Signed)
  Restart your diabetes medication.  Stop the metformin like you did.  Prescription for hydrocodone for pain provided.  Return in 3 months for diabetes check.

## 2014-04-15 NOTE — Progress Notes (Signed)
Pre visit review using our clinic review tool, if applicable. No additional management support is needed unless otherwise documented below in the visit note. 

## 2014-04-21 ENCOUNTER — Ambulatory Visit (INDEPENDENT_AMBULATORY_CARE_PROVIDER_SITE_OTHER): Payer: Medicare PPO | Admitting: Adult Health

## 2014-04-21 ENCOUNTER — Encounter: Payer: Self-pay | Admitting: Adult Health

## 2014-04-21 VITALS — BP 150/88 | HR 82 | Temp 98.6°F | Resp 14

## 2014-04-21 DIAGNOSIS — R221 Localized swelling, mass and lump, neck: Secondary | ICD-10-CM

## 2014-04-21 DIAGNOSIS — R220 Localized swelling, mass and lump, head: Secondary | ICD-10-CM | POA: Insufficient documentation

## 2014-04-21 DIAGNOSIS — R21 Rash and other nonspecific skin eruption: Secondary | ICD-10-CM | POA: Insufficient documentation

## 2014-04-21 MED ORDER — PREDNISONE 10 MG PO TABS: ORAL_TABLET | ORAL | Status: DC

## 2014-04-21 MED ORDER — LEVOFLOXACIN 500 MG PO TABS: 500.0000 mg | ORAL_TABLET | Freq: Every day | ORAL | Status: DC

## 2014-04-21 NOTE — Progress Notes (Signed)
Pre visit review using our clinic review tool, if applicable. No additional management support is needed unless otherwise documented below in the visit note. 

## 2014-04-21 NOTE — Progress Notes (Signed)
Patient ID: Rachael JacksNancy Stokes, female   DOB: 1951-05-05, 63 y.o.   MRN: [id number]   Subjective:    Patient ID: Rachael JacksNancy Turvey, female    DOB: 1951-05-05, 63 y.o.   MRN: [id number]  HPI  Pt is a pleasant 63 y/o female with severe hearing impairment who presents to clinic with right sided facial swelling and pain with swallowing which began approximately 1 week ago. She reports similar symptoms approximately 3 weeks ago. She took benadryl, iced her neck and woke up better the next morning. She reports trying to do the same thing this time but her symptoms have not improved. She c/o pain in the right ear, right sided neck pain with swelling which is tender to touch and warm. No recent dental procedures. She has a slight rash noted on the right side of her neck coming towards the midline of chin. No new medications or foods. [facility] not [facility] there in about a month. She has been in a hot tub recently for her hip pain. She reports low grade temperature but none in clinic.  Past Medical History  Diagnosis Date  . Anemia   . Arthritis   . History of chicken pox   . Depression   . Diabetes mellitus without complication   . GERD (gastroesophageal reflux disease)   . Allergy   . Hypertension   . History of blood transfusion   . History of urinary tract infection   . Sleep apnea   . Fatty liver     u/s 08/2013  . Chronic pain     Left hip since surgery 2003    Current Outpatient Prescriptions on File Prior to Visit  Medication Sig Dispense Refill  . acetaminophen (TYLENOL) 650 MG CR tablet Take 650 mg by mouth every 8 (eight) hours as needed for pain.      Marland Kitchen. albuterol (PROVENTIL HFA;VENTOLIN HFA) 108 (90 BASE) MCG/ACT inhaler Inhale 2 puffs into the lungs every 6 (six) hours as needed for wheezing or shortness of breath.      Marland Kitchen. aspirin 81 MG tablet Take 81 mg by mouth daily.      . diclofenac sodium (VOLTAREN) 1 % GEL Apply 2 g topically 2 (two) times  daily.  100 g  5  . EPINEPHrine (EPI-PEN) 0.3 mg/0.3 mL SOAJ injection Inject 0.3 mg into the muscle once.      Marland Kitchen. FLUTICASONE PROPIONATE  HFA IN Inhale 50 mcg into the lungs 2 (two) times daily.      Marland Kitchen. glimepiride (AMARYL) 4 MG tablet One tablet 2 times a day  60 tablet  6  . hydrochlorothiazide (HYDRODIURIL) 25 MG tablet Take 25 mg by mouth. Take 1 tablet daily      . HYDROcodone-acetaminophen (NORCO/VICODIN) 5-325 MG per tablet Take 1 tablet by mouth every 12 (twelve) hours as needed.  60 tablet  0  . ipratropium-albuterol (DUONEB) 0.5-2.5 (3) MG/3ML SOLN Take 3 mLs by nebulization every 4 (four) hours as needed.      . linagliptin (TRADJENTA) 5 MG TABS tablet Take 1 tablet (5 mg total) by mouth daily.  30 tablet  6  . promethazine (PHENERGAN) 25 MG tablet Take 1 tablet (25 mg total) by mouth every 8 (eight) hours as needed for nausea or vomiting.  30 tablet  0  . ranitidine (ZANTAC) 150 MG tablet Take 150 mg by mouth 2 (two) times daily as needed.       .Marland Kitchen  SALINE NASAL SPRAY NA Place into the nose.      . zolpidem (AMBIEN) 10 MG tablet Take 10 mg by mouth. Take 1 tablet by mouth at bedtime as needed      . zoster vaccine live, PF, (ZOSTAVAX) 16109 UNT/0.65ML injection Inject 19,400 Units into the skin once.  1 each  0   No current facility-administered medications on file prior to visit.     Review of Systems  HENT: Positive for ear pain (right ear pain), facial swelling and trouble swallowing (pain with swallowing).   Respiratory: Negative for cough and shortness of breath.   Cardiovascular: Negative.   All other systems reviewed and are negative.      Objective:  BP 150/88  Pulse 82  Temp(Src) 98.6 F (37 C) (Oral)  Resp 14  SpO2 95%   Physical Exam  Constitutional: She is oriented to person, place, and time. No distress.  HENT:  Head: Normocephalic and atraumatic.  Right Ear: External ear normal.  Mouth/Throat: Oropharynx is clear and moist.  Tongue is normal in size. No  swelling noted. No swelling noted within her mouth. Lips are not swollen  Neck:  Right anterior cervical adenopathy, tender to touch  Cardiovascular: Normal rate and regular rhythm.   Pulmonary/Chest: Effort normal. No respiratory distress.  Musculoskeletal: Normal range of motion.  Lymphadenopathy:    She has cervical adenopathy.  Neurological: She is alert and oriented to person, place, and time.  Skin: Skin is warm. Rash noted. There is erythema.  Maculopapular rash on right side of neck towards midline at chin. She does not have any rash on her face.  Psychiatric: She has a normal mood and affect. Her behavior is normal. Judgment and thought content normal.      Assessment & Plan:   1. Right facial swelling Not having any trouble breathing. No new medications or foods. Suspect this inflammatory reaction may be 2/2 to infection. ?hot tub. Start levaquin 500 mg daily x 10 days. RTC in 4-5 days for follow up.  2. Rash and nonspecific skin eruption Will start prednisone taper starting with 60 mg and tapering by 10 mg daily until done.  Note, 30 min spent in face to face communication with patient in the assessment, evaluation, consultation with Dr. Darrick Huntsman and pt pertaining to the problems listed above. Pt is severely hard of hearing and everything has to be written down for her to understand.

## 2014-04-21 NOTE — Patient Instructions (Signed)
  Start Levaquin 500 mg daily for 10 days.  Start prednisone taper as follows:  Day #1 - take 6 tablets Day #2 - take 5 tablets Day #3 - take 4 tablets Day #4 - take 3 tablets Day #5 - take 2 tablets Day #6 - take 1 tablet  Make sure that you do not skip any meals. Levaquin can increase the effects of glimepiride and cause hypoglycemia.  Please call if your symptoms are not improved within 4-5 days or sooner if necessary.

## 2014-04-28 ENCOUNTER — Ambulatory Visit (INDEPENDENT_AMBULATORY_CARE_PROVIDER_SITE_OTHER): Payer: Medicare PPO | Admitting: Adult Health

## 2014-04-28 ENCOUNTER — Encounter: Payer: Self-pay | Admitting: Adult Health

## 2014-04-28 VITALS — BP 150/90 | HR 97 | Temp 98.5°F | Resp 14

## 2014-04-28 DIAGNOSIS — R609 Edema, unspecified: Secondary | ICD-10-CM

## 2014-04-28 MED ORDER — PREDNISONE 10 MG PO TABS: ORAL_TABLET | ORAL | Status: DC

## 2014-04-28 NOTE — Progress Notes (Signed)
Patient ID: Rachael Stokes, female   DOB: October 08, 1950, 63 y.o.   MRN: [id number]   Subjective:    Patient ID: Rachael Stokes, female    DOB: October 08, 1950, 63 y.o.   MRN: [id number]  HPI  Pt returns for f/u of right facial swelling and pain. She is still on antibiotic and finishing prednisone taper. Reports that she was improving until Friday when the swelling re-appeared and pain persists. No fever or chills reported.  Past Medical History  Diagnosis Date  . Anemia   . Arthritis   . History of chicken pox   . Depression   . Diabetes mellitus without complication   . GERD (gastroesophageal reflux disease)   . Allergy   . Hypertension   . History of blood transfusion   . History of urinary tract infection   . Sleep apnea   . Fatty liver     u/s 08/2013  . Chronic pain     Left hip since surgery 2003    Current Outpatient Prescriptions on File Prior to Visit  Medication Sig Dispense Refill  . acetaminophen (TYLENOL) 650 MG CR tablet Take 650 mg by mouth every 8 (eight) hours as needed for pain.      Marland Kitchen. albuterol (PROVENTIL HFA;VENTOLIN HFA) 108 (90 BASE) MCG/ACT inhaler Inhale 2 puffs into the lungs every 6 (six) hours as needed for wheezing or shortness of breath.      Marland Kitchen. aspirin 81 MG tablet Take 81 mg by mouth daily.      . diclofenac sodium (VOLTAREN) 1 % GEL Apply 2 g topically 2 (two) times daily.  100 g  5  . EPINEPHrine (EPI-PEN) 0.3 mg/0.3 mL SOAJ injection Inject 0.3 mg into the muscle once.      Marland Kitchen. FLUTICASONE PROPIONATE  HFA IN Inhale 50 mcg into the lungs 2 (two) times daily.      Marland Kitchen. glimepiride (AMARYL) 4 MG tablet One tablet 2 times a day  60 tablet  6  . hydrochlorothiazide (HYDRODIURIL) 25 MG tablet Take 25 mg by mouth. Take 1 tablet daily      . HYDROcodone-acetaminophen (NORCO/VICODIN) 5-325 MG per tablet Take 1 tablet by mouth every 12 (twelve) hours as needed.  60 tablet  0  . ipratropium-albuterol (DUONEB) 0.5-2.5 (3) MG/3ML SOLN Take 3 mLs by nebulization every 4 (four)  hours as needed.      Marland Kitchen. levofloxacin (LEVAQUIN) 500 MG tablet Take 1 tablet (500 mg total) by mouth daily. Take 1 tablet daily for 10 days.  10 tablet  0  . linagliptin (TRADJENTA) 5 MG TABS tablet Take 1 tablet (5 mg total) by mouth daily.  30 tablet  6  . predniSONE (DELTASONE) 10 MG tablet Take 60 mg (6 tablets) on the first day then decrease by 10 mg (1 tablet) daily until done.  21 tablet  0  . promethazine (PHENERGAN) 25 MG tablet Take 1 tablet (25 mg total) by mouth every 8 (eight) hours as needed for nausea or vomiting.  30 tablet  0  . ranitidine (ZANTAC) 150 MG tablet Take 150 mg by mouth 2 (two) times daily as needed.       Marland Kitchen. SALINE NASAL SPRAY NA Place into the nose.      . zolpidem (AMBIEN) 10 MG tablet Take 10 mg by mouth. Take 1 tablet by mouth at bedtime as needed      . zoster vaccine live, PF, (ZOSTAVAX) [id number] UNT/0.65ML injection Inject 19,400 Units into the skin once.  1 each  0   No current facility-administered medications on file prior to visit.     Review of Systems  HENT: Positive for facial swelling (right side of jaw and neck).   Respiratory: Negative.   Neurological: Negative.   All other systems reviewed and are negative.      Objective:  BP 150/90  Pulse 97  Temp(Src) 98.5 F (36.9 C) (Oral)  Resp 14  SpO2 94%   Physical Exam  Constitutional: She is oriented to person, place, and time. No distress.  HENT:  Right side of jaw and neck is painful to touch. Swelling.  Cardiovascular: Normal rate and regular rhythm.   Pulmonary/Chest: Effort normal. No respiratory distress.  Musculoskeletal: Normal range of motion.  Neurological: She is alert and oriented to person, place, and time.  Skin: Skin is warm and dry.  Psychiatric: She has a normal mood and affect. Her behavior is normal. Judgment and thought content normal.      Assessment & Plan:   1. Swelling Right side of jaw/neck. Painful to touch. She has completed her prednisone taper. Reports  improvement initially but once finished the taper her symptoms returned. I am sending her for CT of neck. Restart taper until we can see why she has swelling. ? Blocked salivary gland vs mass - CT Soft Tissue Neck W Contrast; Future - Ambulatory referral to ENT  Note, spent 30 min in face to face communication with pt. She is hearing impaired and every instruction, conversation has to be written down for pt. She can sometimes read lips but not all the time.

## 2014-04-28 NOTE — Progress Notes (Signed)
Pre visit review using our clinic review tool, if applicable. No additional management support is needed unless otherwise documented below in the visit note. 

## 2014-05-01 ENCOUNTER — Other Ambulatory Visit: Payer: Self-pay | Admitting: *Deleted

## 2014-05-01 ENCOUNTER — Ambulatory Visit: Payer: Self-pay | Admitting: Adult Health

## 2014-05-01 MED ORDER — LINAGLIPTIN 5 MG PO TABS: 5.0000 mg | ORAL_TABLET | Freq: Every day | ORAL | Status: DC

## 2014-05-05 ENCOUNTER — Telehealth: Payer: Self-pay | Admitting: Adult Health

## 2014-05-05 NOTE — Telephone Encounter (Signed)
Pt called to get the results of her CT Scan. Pt hung up each time she was transferred to the nurse. When called back again she starting yelling "stop giving me the running around and just give me my results." Pt was asked to not to yell and pt stated that she was not yell and not to tell her that again. Pt was informed that the CT scan result were not available at this time and that when they are she will be called by the CMA. Pt stated that she is tried of getting the run around and hung up. Please call pt with ct scan results

## 2014-05-05 NOTE — Telephone Encounter (Signed)
Results are in, given to Raquel for review.

## 2014-05-06 ENCOUNTER — Telehealth: Payer: Self-pay

## 2014-05-06 NOTE — Telephone Encounter (Signed)
Per Raquel: Notified patient CT scan was normal.

## 2014-05-08 NOTE — Telephone Encounter (Signed)
Rachael Stokes came back and said don't discharge patient that Dr. Darrick Huntsmanullo would take patient after she leaves.

## 2014-05-26 ENCOUNTER — Encounter: Payer: Self-pay | Admitting: Adult Health

## 2014-07-16 ENCOUNTER — Ambulatory Visit: Payer: Medicare PPO | Admitting: Adult Health

## 2014-07-16 DIAGNOSIS — Z0289 Encounter for other administrative examinations: Secondary | ICD-10-CM

## 2014-07-25 ENCOUNTER — Ambulatory Visit: Payer: Self-pay | Admitting: Oncology

## 2014-07-25 LAB — CBC CANCER CENTER
BASOS ABS: 0.1 x10 3/mm (ref 0.0–0.1)
Basophil %: 0.8 %
EOS PCT: 2.2 %
Eosinophil #: 0.3 x10 3/mm (ref 0.0–0.7)
HCT: 50.7 % — ABNORMAL HIGH (ref 35.0–47.0)
HGB: 16.7 g/dL — ABNORMAL HIGH (ref 12.0–16.0)
LYMPHS ABS: 2.6 x10 3/mm (ref 1.0–3.6)
LYMPHS PCT: 22.1 %
MCH: 28.8 pg (ref 26.0–34.0)
MCHC: 33 g/dL (ref 32.0–36.0)
MCV: 87 fL (ref 80–100)
MONOS PCT: 5.6 %
Monocyte #: 0.7 x10 3/mm (ref 0.2–0.9)
NEUTROS ABS: 8.3 x10 3/mm — AB (ref 1.4–6.5)
Neutrophil %: 69.3 %
Platelet: 206 x10 3/mm (ref 150–440)
RBC: 5.81 10*6/uL — ABNORMAL HIGH (ref 3.80–5.20)
RDW: 13.7 % (ref 11.5–14.5)
WBC: 11.9 x10 3/mm — AB (ref 3.6–11.0)

## 2014-07-25 LAB — IRON AND TIBC
IRON: 70 ug/dL (ref 50–170)
Iron Bind.Cap.(Total): 402 ug/dL (ref 250–450)
Iron Saturation: 17 %
Unbound Iron-Bind.Cap.: 332 ug/dL

## 2014-07-25 LAB — FERRITIN: FERRITIN (ARMC): 359 ng/mL (ref 8–388)

## 2014-07-27 ENCOUNTER — Ambulatory Visit: Payer: Self-pay | Admitting: Oncology

## 2014-08-25 LAB — CBC CANCER CENTER
BASOS ABS: 0.1 x10 3/mm (ref 0.0–0.1)
Basophil %: 0.7 %
EOS PCT: 2 %
Eosinophil #: 0.2 x10 3/mm (ref 0.0–0.7)
HCT: 52.1 % — AB (ref 35.0–47.0)
HGB: 17.3 g/dL — AB (ref 12.0–16.0)
Lymphocyte #: 2.2 x10 3/mm (ref 1.0–3.6)
Lymphocyte %: 20.3 %
MCH: 29.1 pg (ref 26.0–34.0)
MCHC: 33.1 g/dL (ref 32.0–36.0)
MCV: 88 fL (ref 80–100)
MONO ABS: 0.6 x10 3/mm (ref 0.2–0.9)
MONOS PCT: 5.7 %
NEUTROS ABS: 7.7 x10 3/mm — AB (ref 1.4–6.5)
Neutrophil %: 71.3 %
PLATELETS: 208 x10 3/mm (ref 150–440)
RBC: 5.92 10*6/uL — ABNORMAL HIGH (ref 3.80–5.20)
RDW: 13.7 % (ref 11.5–14.5)
WBC: 10.7 x10 3/mm (ref 3.6–11.0)

## 2014-08-26 ENCOUNTER — Ambulatory Visit: Payer: Self-pay | Admitting: Oncology

## 2014-11-24 ENCOUNTER — Ambulatory Visit: Payer: Self-pay | Admitting: Oncology

## 2014-11-25 ENCOUNTER — Ambulatory Visit
Admit: 2014-11-25 | Disposition: A | Discharge status: Home / Self Care | Payer: Self-pay | Attending: Oncology | Admitting: Oncology

## 2014-12-10 IMAGING — CR DG KNEE COMPLETE 4+V*R*
1 series · 4 of 4 positions shown · non-contrast
Comparison: 06/29/2010

CLINICAL DATA: Tripped and fell on sidewalk, pain post fall, unable
to internally rotate knee

EXAM:
RIGHT KNEE - COMPLETE 4+ VIEW

[Series 1: t knee ap right · 0.14mm/px · 4 of 4 slices shown]
[im 1/4]
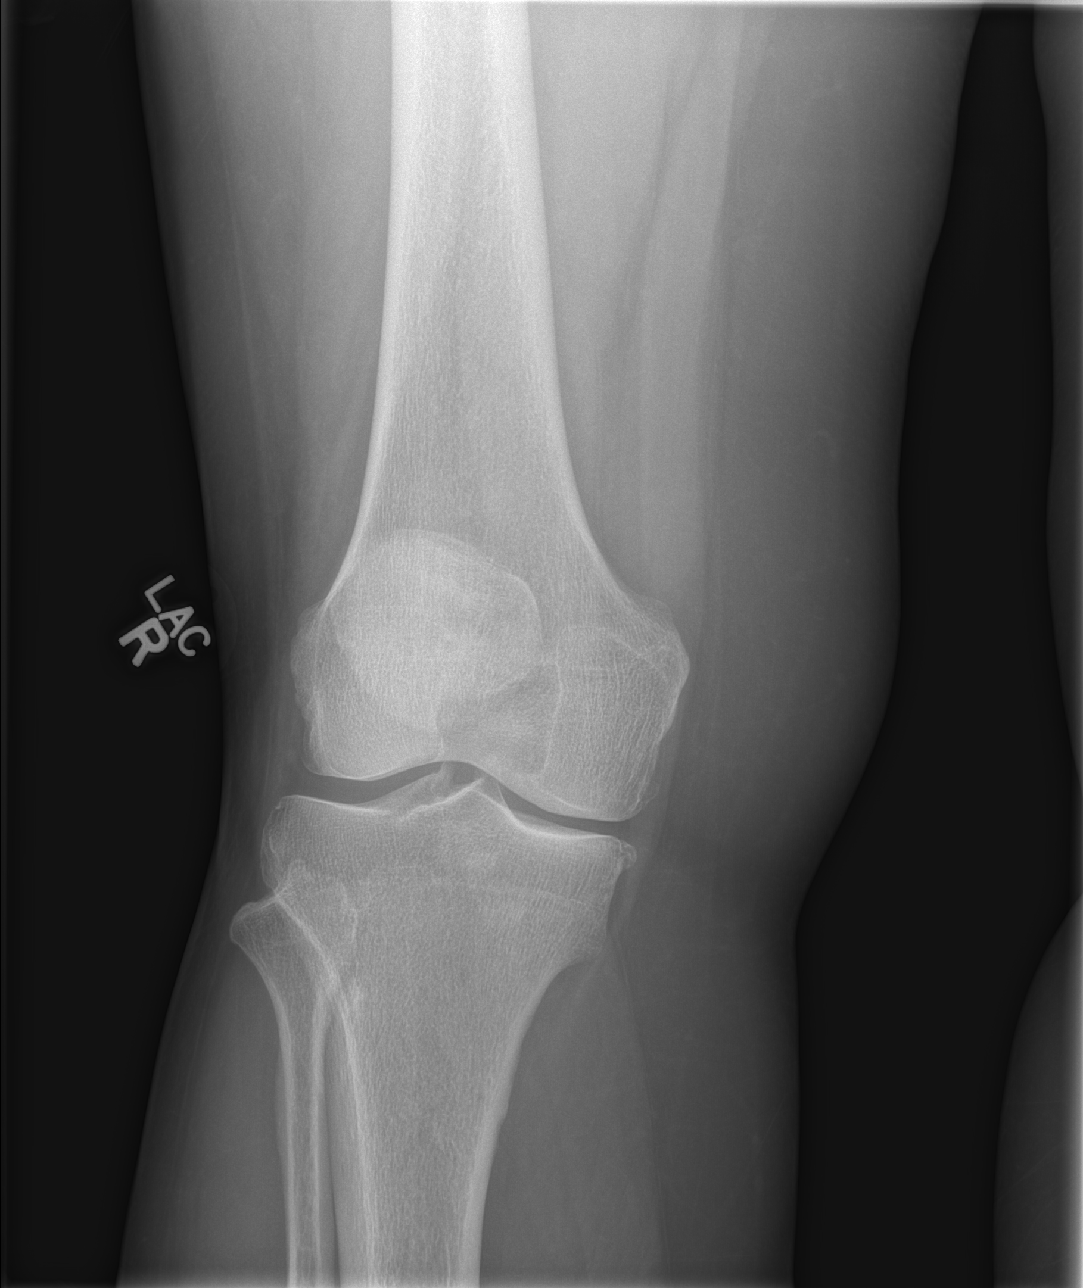
[im 2/4]
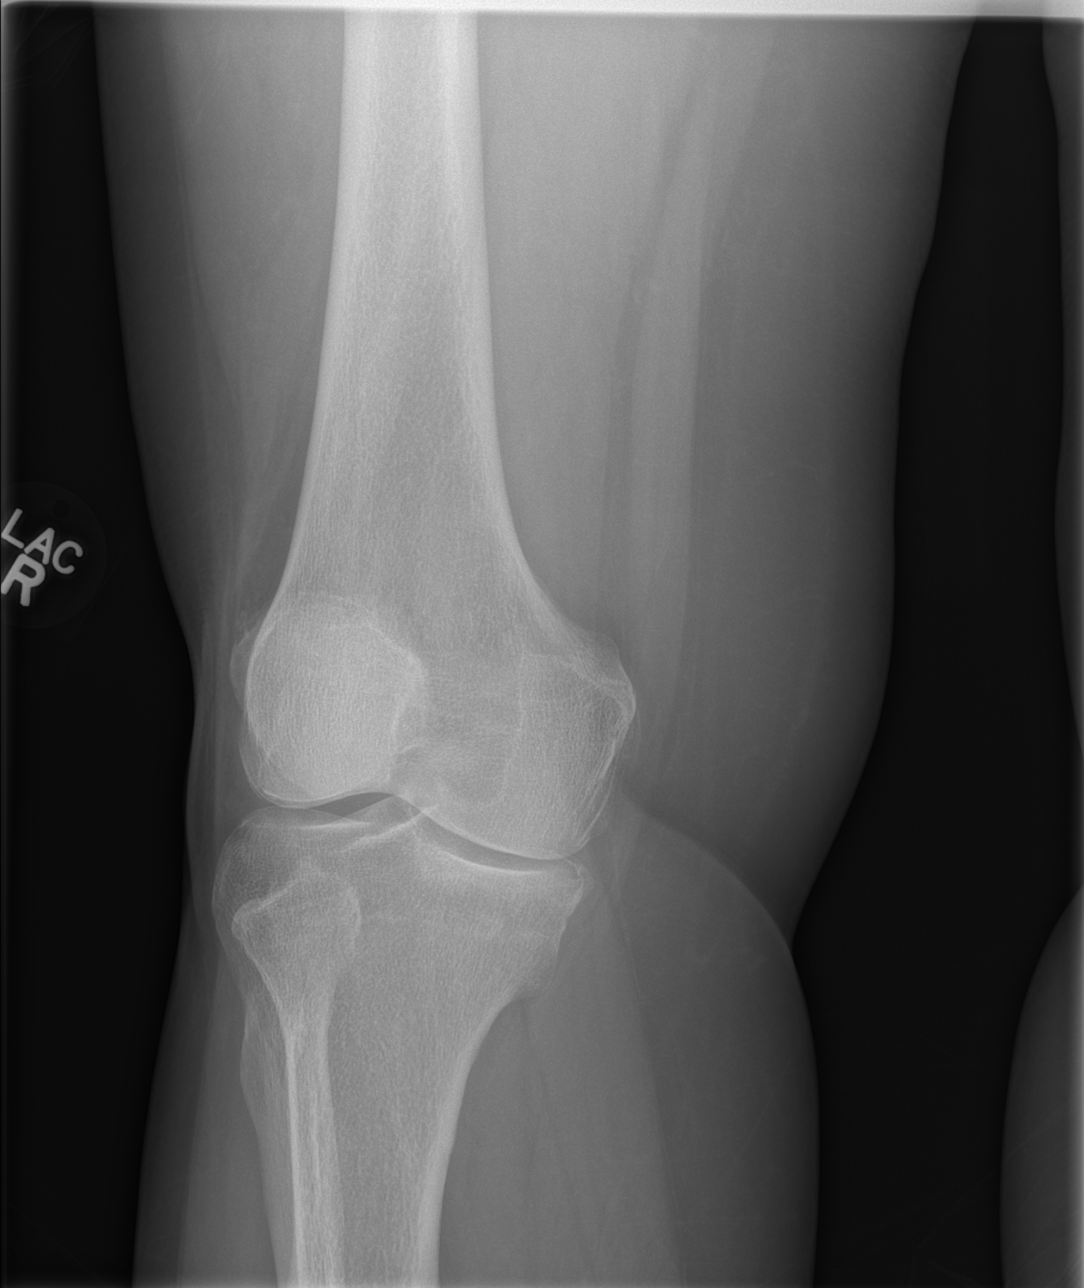
[im 3/4]
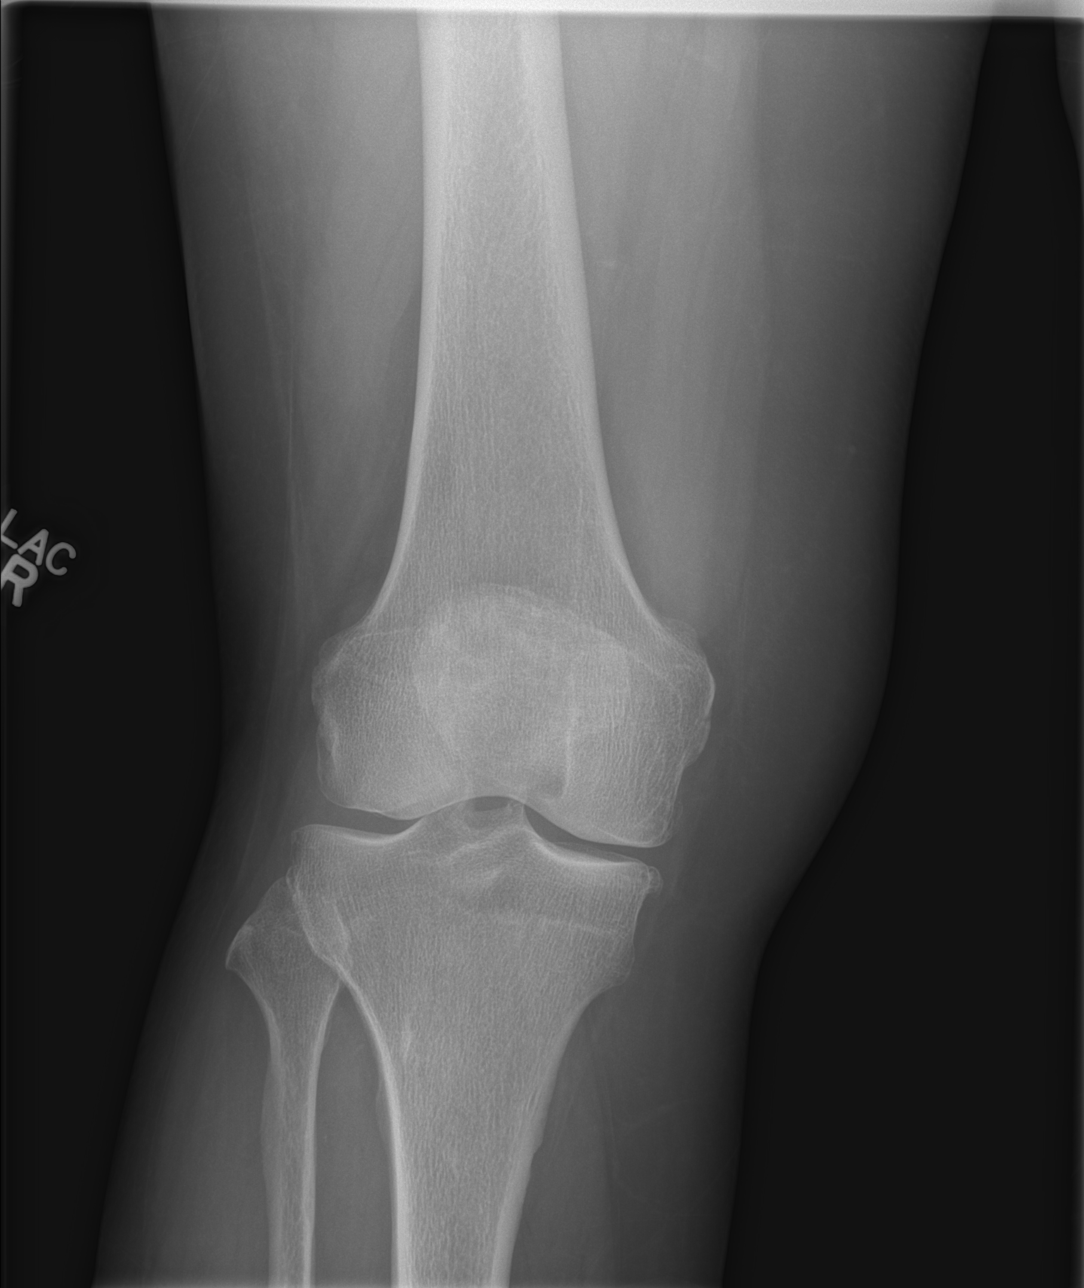
[im 4/4]
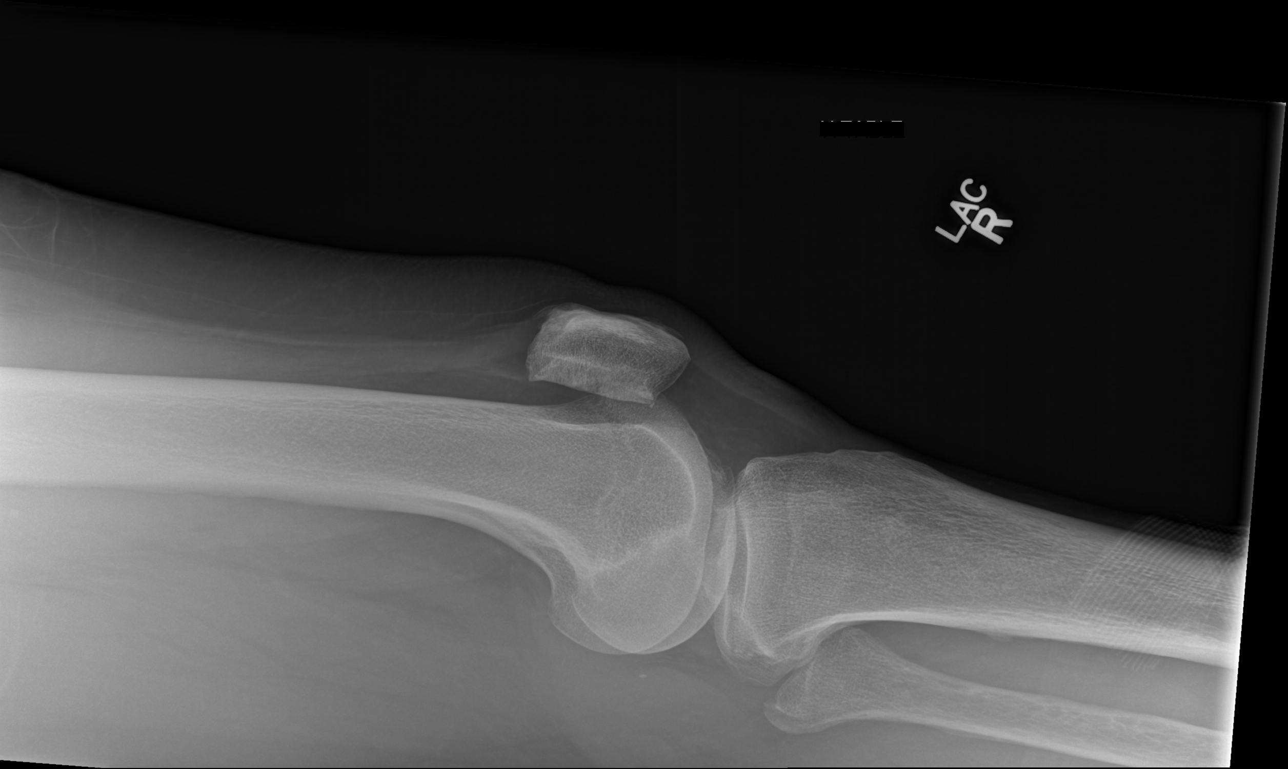

[4 of 4 positions shown; findings below may reference images not displayed]

FINDINGS: Degenerative changes with joint space narrowing and marginal spur
formation at medial compartment.

Bones appear slightly demineralized.

Remaining joint spaces preserved.

No acute fracture, dislocation or bone destruction.
IMPRESSION: Degenerative changes at medial compartment RIGHT knee.

No acute abnormalities.

## 2014-12-10 IMAGING — CT CT MAXILLOFACIAL WITHOUT CONTRAST
3 series · 16 of 47 positions shown, 19 images · non-contrast
Comparison: None

CLINICAL DATA: Tripped and fell on sidewalk striking RIGHT side,
RIGHT periorbital pain and hematoma, swelling above RIGHT eye

EXAM:
CT MAXILLOFACIAL WITHOUT CONTRAST
TECHNIQUE: Multidetector CT imaging of the maxillofacial structures was
performed. Multiplanar CT image reconstructions were also generated.
A small metallic BB was placed on the right temple in order to
reliably differentiate right from left.

[Series 2: max soft · axial · 0.33mm/px · z∈[-86,+46]mm · 10 of 78 slices shown, 13 images]
[im 6/78  brain]
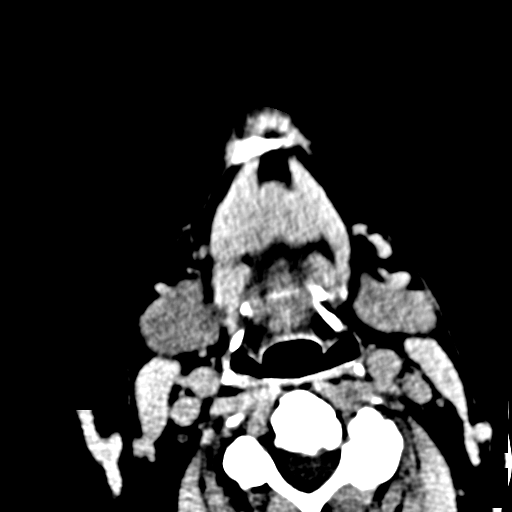
[im 6/78  bone]
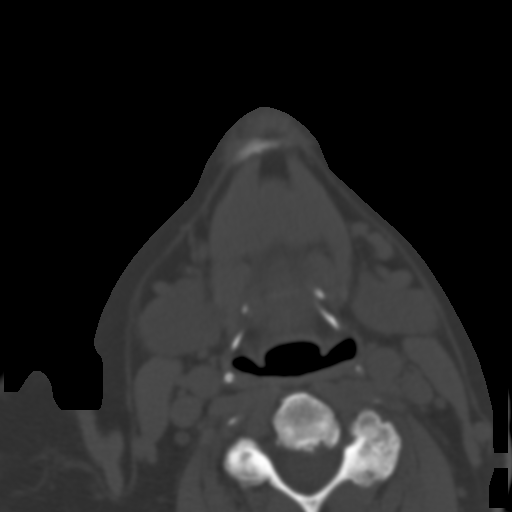
[im 14/78  bone]
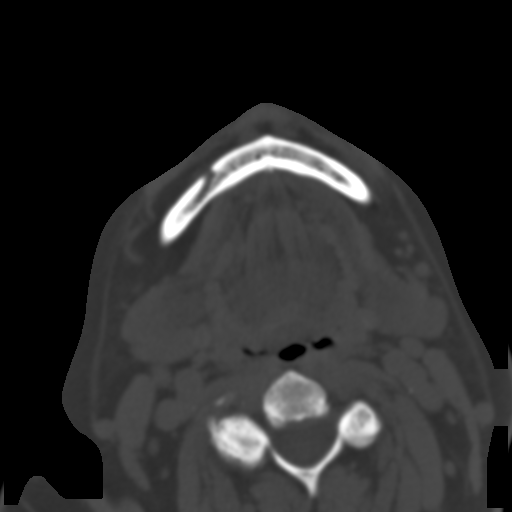
[im 22/78  bone]
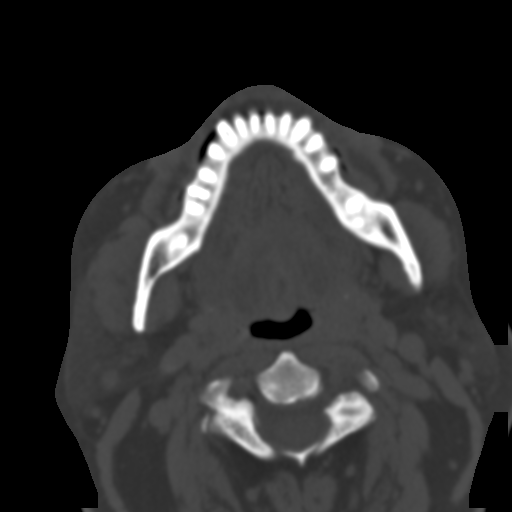
[im 27/78  bone]
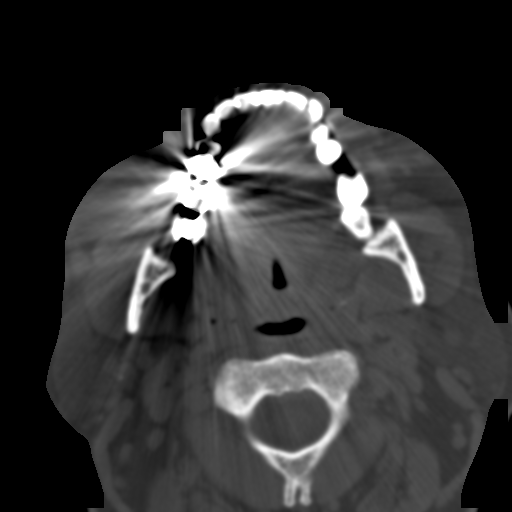
[im 35/78  brain]
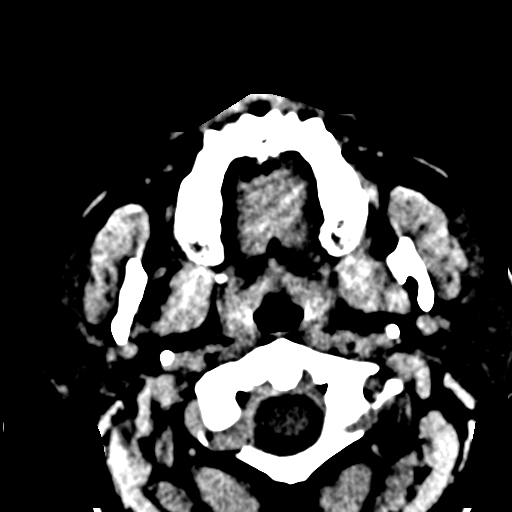
[im 35/78  bone]
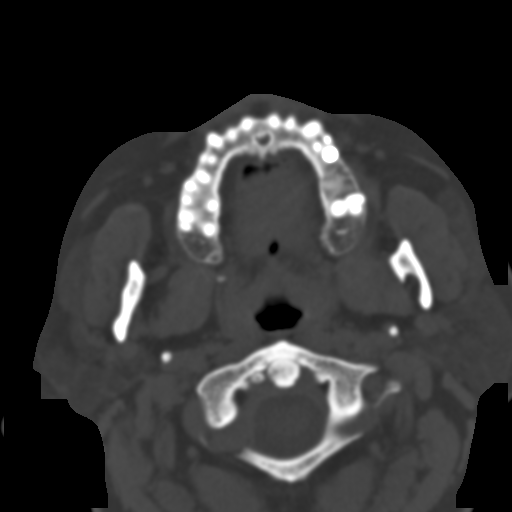
[im 43/78  bone]
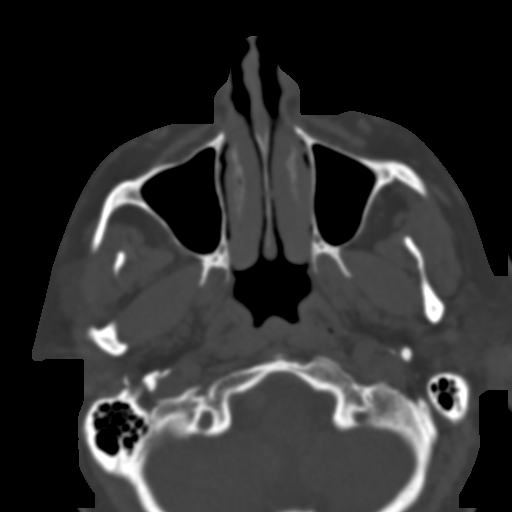
[im 51/78  bone]
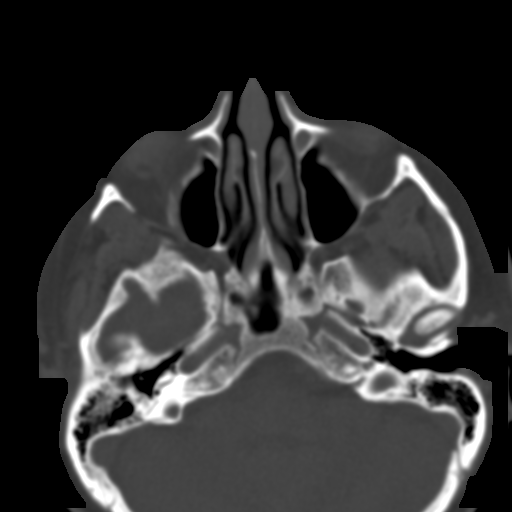
[im 59/78  bone]
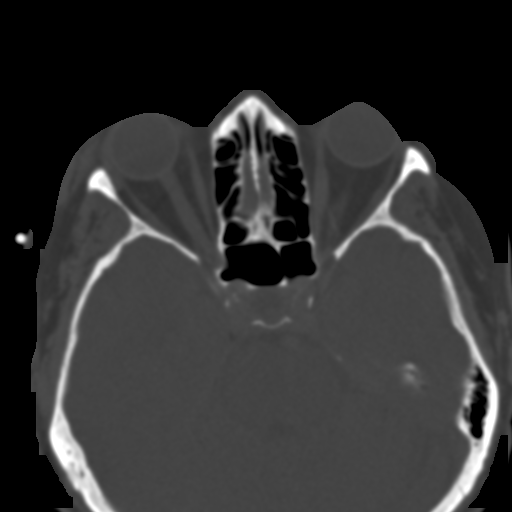
[im 64/78  brain]
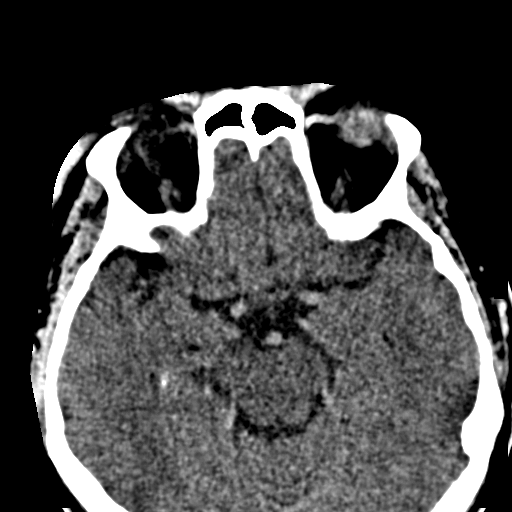
[im 64/78  bone]
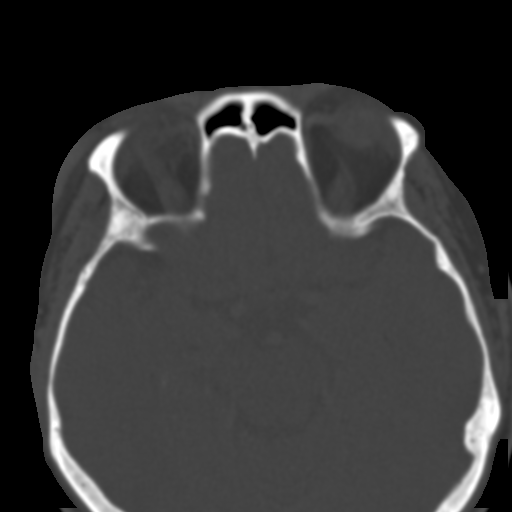
[im 72/78  bone]
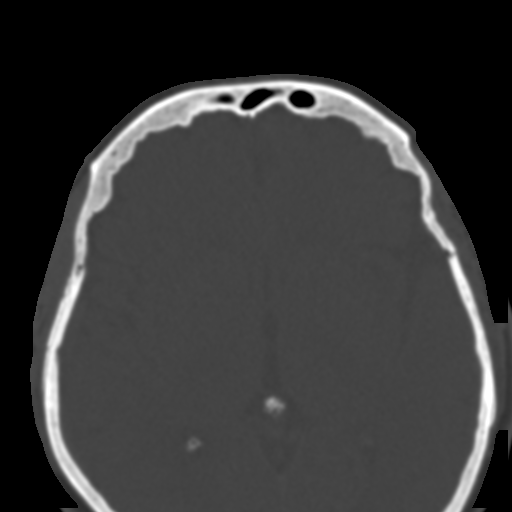

[Series 4: coronal soft · coronal · 0.32mm/px · 3 of 75 slices shown]
[im 25/75  bone]
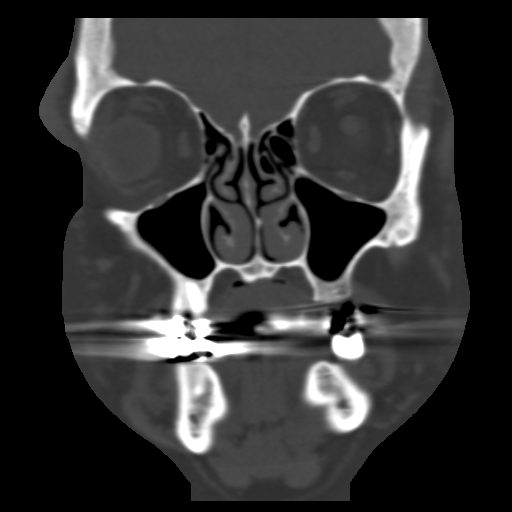
[im 33/75  bone]
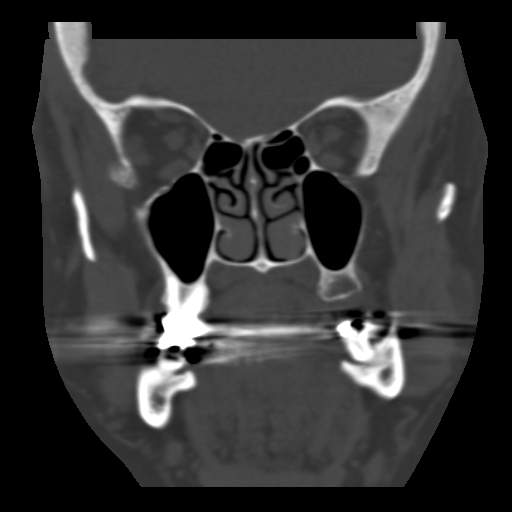
[im 42/75  bone]
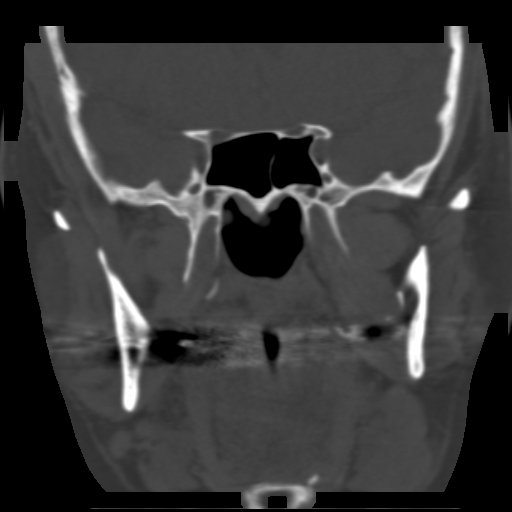

[Series 5: sagittal soft · sagittal · 0.32mm/px · 3 of 83 slices shown]
[im 28/83  bone]
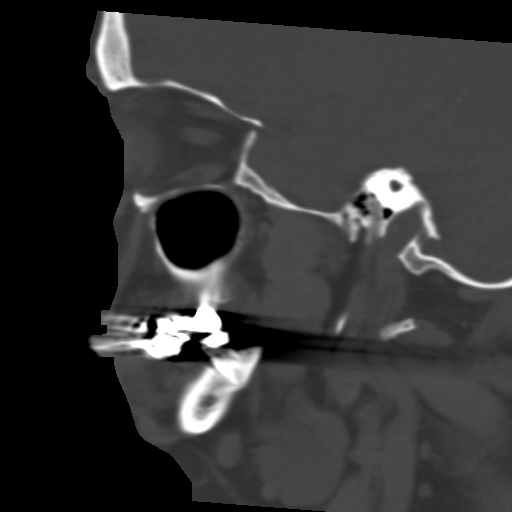
[im 42/83  bone]
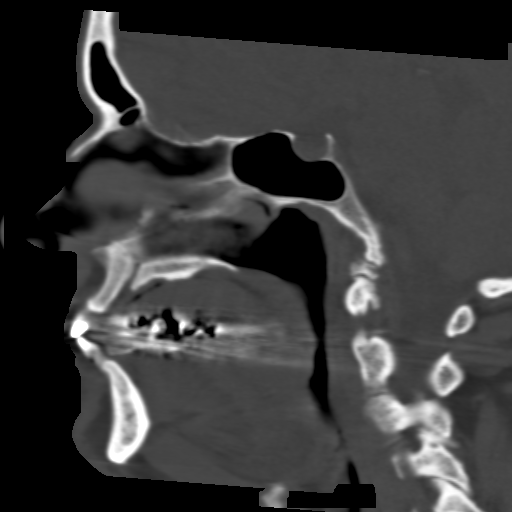
[im 55/83  bone]
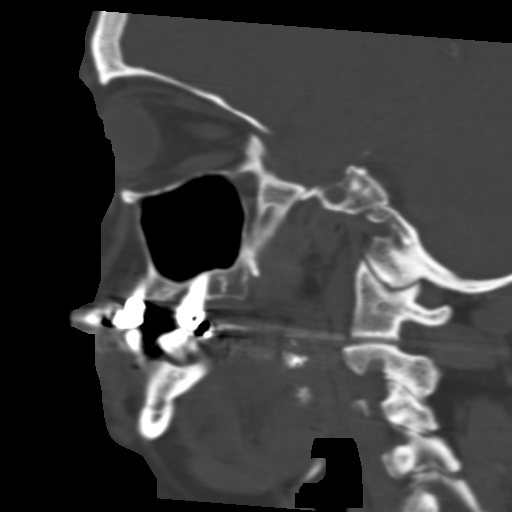

[16 of 47 positions shown; findings below may reference images not displayed]

FINDINGS: No acute intracranial abnormalities identified.

RIGHT periorbital hematoma extending superolateral to the RIGHT
orbit.

Intraorbital soft tissue planes clear.

Paranasal sinuses, mastoid air cells and middle ear cavities clear.

Scattered beam hardening artifacts of dental origin.

No facial bone fracture identified.
IMPRESSION: No acute facial bone abnormalities.

RIGHT periorbital hematoma and soft tissue swelling.

## 2014-12-10 IMAGING — CR RIGHT FOREARM - 2 VIEW
1 series · 2 of 2 positions shown · non-contrast
Comparison: None.

CLINICAL DATA: Status post fall now with swelling and bruising over
the distal radius and ulna

EXAM:
RIGHT FOREARM - 2 VIEW

[Series 1: x forearm ap right · 0.14mm/px · 2 of 2 slices shown]
[im 1/2]
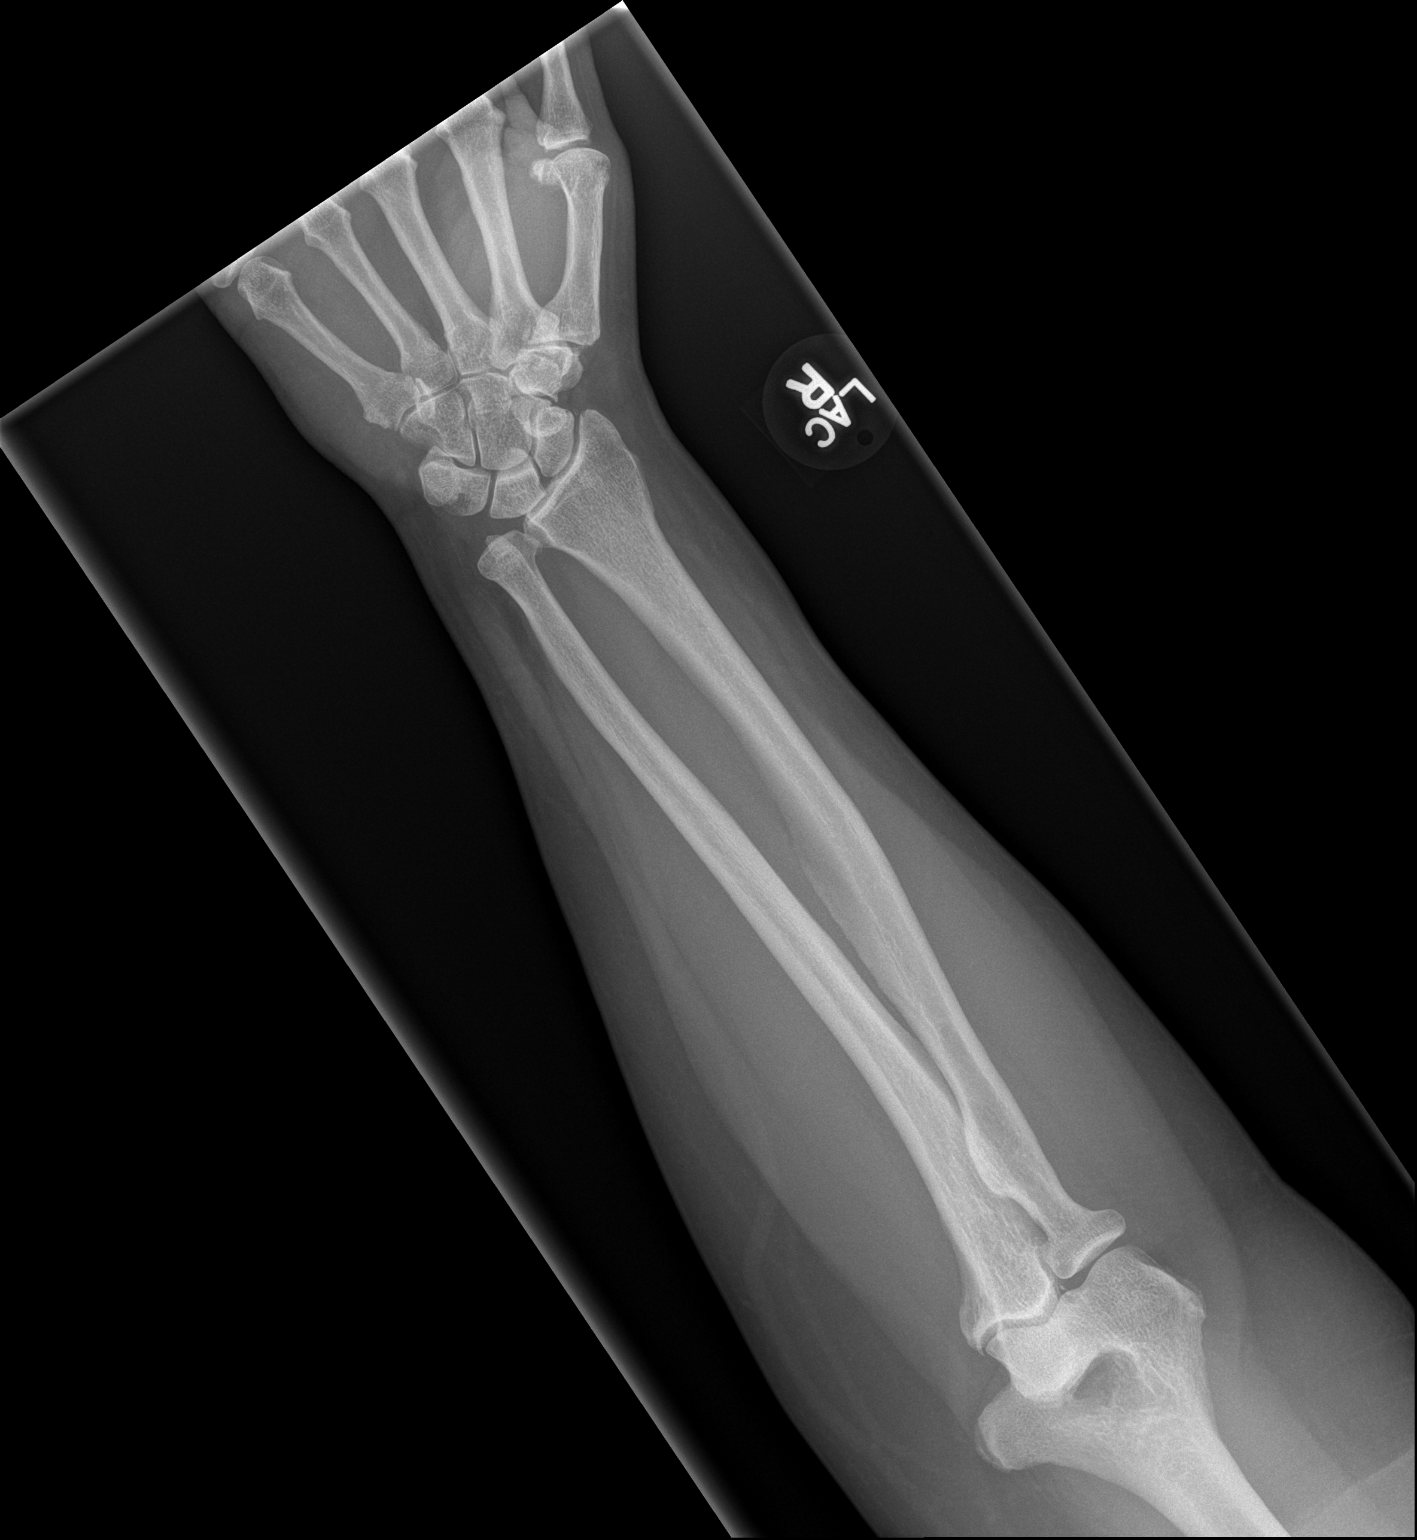
[im 2/2]
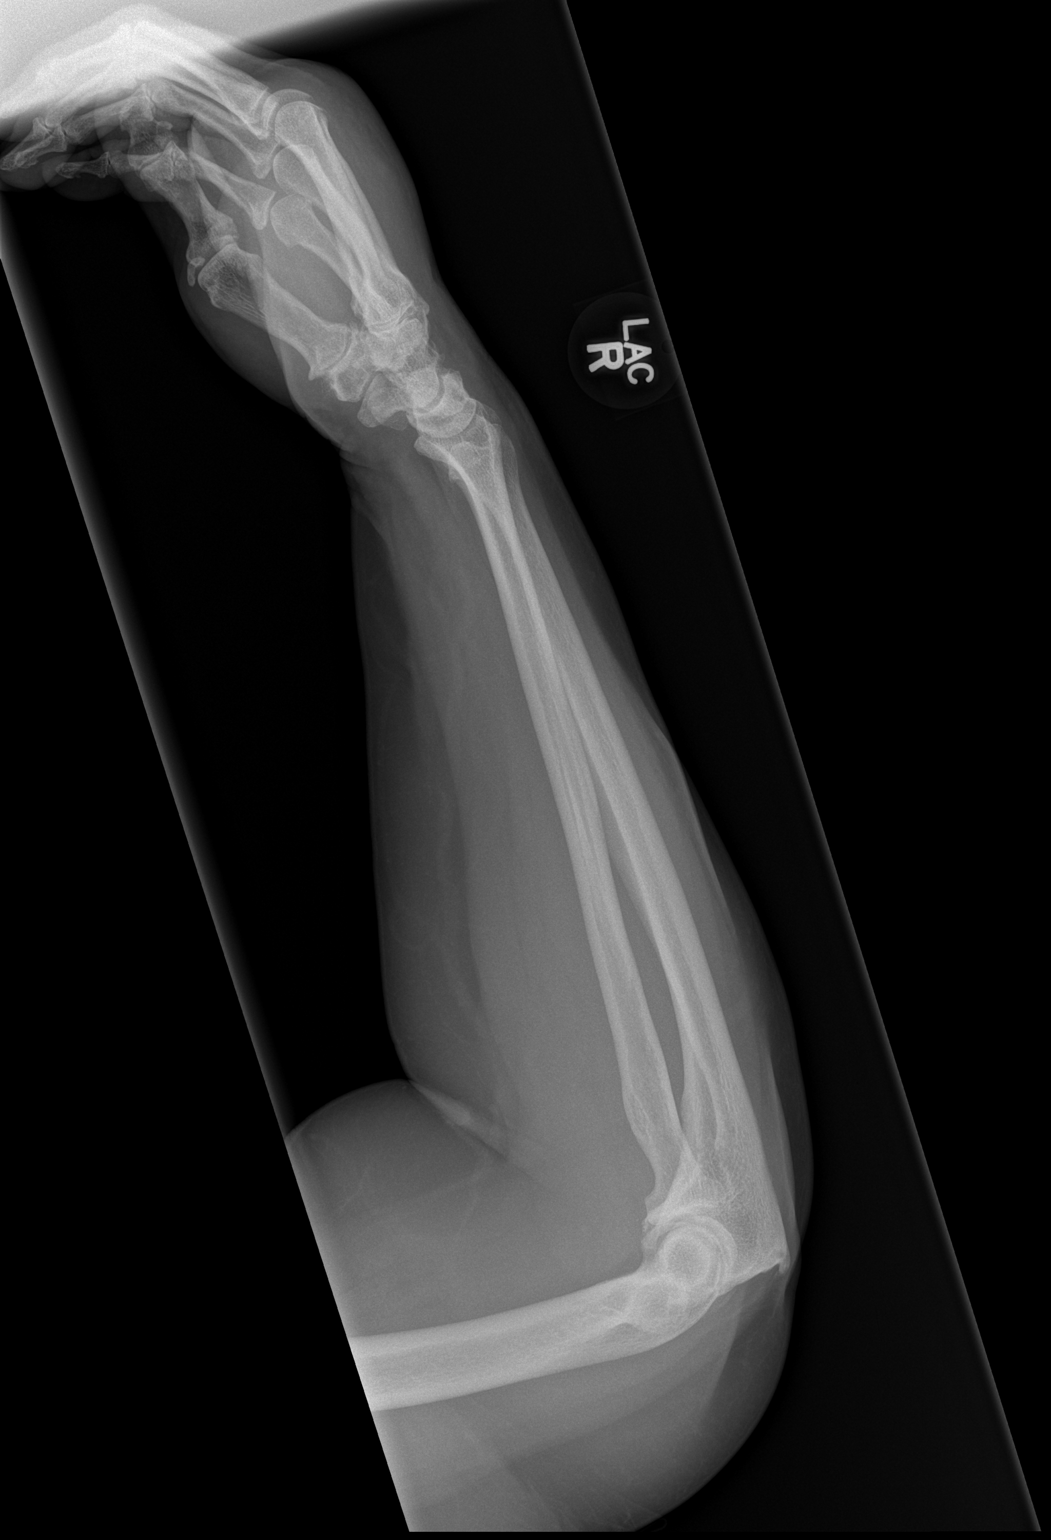

[2 of 2 positions shown; findings below may reference images not displayed]

FINDINGS: AP and lateral views of the right radius and ulna reveal the bones
to be adequately mineralized. There is no acute fracture of the
radius. The distal ulna exhibits a subtle acute angulation the antro
laterally of the cortical contour which may reflect an impacted
fracture. The radial head appears intact. There is a small olecranon
spur. No elbow effusion is demonstrated.
IMPRESSION: There may be a minimally impacted fracture of the distal ulna. The
adjacent distal radius appears intact. The radius and ulna appear
intact elsewhere.

## 2014-12-24 ENCOUNTER — Ambulatory Visit
Admit: 2014-12-24 | Disposition: A | Discharge status: Home / Self Care | Payer: Self-pay | Attending: Physical Medicine and Rehabilitation | Admitting: Physical Medicine and Rehabilitation

## 2015-01-05 ENCOUNTER — Other Ambulatory Visit: Payer: Self-pay | Admitting: *Deleted

## 2015-01-05 MED ORDER — LINAGLIPTIN 5 MG PO TABS: 5.0000 mg | ORAL_TABLET | Freq: Every day | ORAL | Status: AC

## 2015-06-11 ENCOUNTER — Other Ambulatory Visit: Payer: Self-pay | Admitting: Internal Medicine

## 2015-06-11 DIAGNOSIS — Z1231 Encounter for screening mammogram for malignant neoplasm of breast: Secondary | ICD-10-CM

## 2015-07-01 ENCOUNTER — Ambulatory Visit: Payer: Medicare PPO

## 2015-07-03 ENCOUNTER — Ambulatory Visit
Admission: RE | Admit: 2015-07-03 | Discharge: 2015-07-03 | Disposition: A | Discharge status: Home / Self Care | Payer: Medicare PPO | Source: Ambulatory Visit | Attending: Internal Medicine | Admitting: Internal Medicine

## 2015-07-03 ENCOUNTER — Other Ambulatory Visit: Payer: Self-pay | Admitting: Internal Medicine

## 2015-07-03 DIAGNOSIS — Z1231 Encounter for screening mammogram for malignant neoplasm of breast: Secondary | ICD-10-CM | POA: Diagnosis not present

## 2015-07-10 ENCOUNTER — Emergency Department: Payer: Medicare PPO

## 2015-07-10 ENCOUNTER — Inpatient Hospital Stay
Admission: EM | Admit: 2015-07-10 | Discharge: 2015-07-13 | DRG: 872 | Disposition: A | Discharge status: Home / Self Care | Payer: Medicare PPO | Attending: Internal Medicine | Admitting: Internal Medicine

## 2015-07-10 DIAGNOSIS — A419 Sepsis, unspecified organism: Secondary | ICD-10-CM

## 2015-07-10 DIAGNOSIS — Z9889 Other specified postprocedural states: Secondary | ICD-10-CM

## 2015-07-10 DIAGNOSIS — Z79899 Other long term (current) drug therapy: Secondary | ICD-10-CM | POA: Diagnosis not present

## 2015-07-10 DIAGNOSIS — N12 Tubulo-interstitial nephritis, not specified as acute or chronic: Secondary | ICD-10-CM | POA: Diagnosis not present

## 2015-07-10 DIAGNOSIS — N10 Acute tubulo-interstitial nephritis: Secondary | ICD-10-CM | POA: Diagnosis present

## 2015-07-10 DIAGNOSIS — Z1639 Resistance to other specified antimicrobial drug: Secondary | ICD-10-CM | POA: Diagnosis present

## 2015-07-10 DIAGNOSIS — Z87891 Personal history of nicotine dependence: Secondary | ICD-10-CM

## 2015-07-10 DIAGNOSIS — Z96642 Presence of left artificial hip joint: Secondary | ICD-10-CM | POA: Diagnosis not present

## 2015-07-10 DIAGNOSIS — Z7982 Long term (current) use of aspirin: Secondary | ICD-10-CM | POA: Diagnosis not present

## 2015-07-10 DIAGNOSIS — I119 Hypertensive heart disease without heart failure: Secondary | ICD-10-CM | POA: Diagnosis present

## 2015-07-10 DIAGNOSIS — Z810 Family history of intellectual disabilities: Secondary | ICD-10-CM | POA: Diagnosis not present

## 2015-07-10 DIAGNOSIS — E1165 Type 2 diabetes mellitus with hyperglycemia: Secondary | ICD-10-CM | POA: Diagnosis not present

## 2015-07-10 DIAGNOSIS — Z803 Family history of malignant neoplasm of breast: Secondary | ICD-10-CM

## 2015-07-10 DIAGNOSIS — Z833 Family history of diabetes mellitus: Secondary | ICD-10-CM

## 2015-07-10 DIAGNOSIS — Z8249 Family history of ischemic heart disease and other diseases of the circulatory system: Secondary | ICD-10-CM | POA: Diagnosis not present

## 2015-07-10 DIAGNOSIS — M5136 Other intervertebral disc degeneration, lumbar region: Secondary | ICD-10-CM | POA: Diagnosis not present

## 2015-07-10 DIAGNOSIS — B9620 Unspecified Escherichia coli [E. coli] as the cause of diseases classified elsewhere: Secondary | ICD-10-CM | POA: Diagnosis not present

## 2015-07-10 DIAGNOSIS — H9190 Unspecified hearing loss, unspecified ear: Secondary | ICD-10-CM | POA: Diagnosis not present

## 2015-07-10 DIAGNOSIS — M549 Dorsalgia, unspecified: Secondary | ICD-10-CM | POA: Diagnosis not present

## 2015-07-10 DIAGNOSIS — R630 Anorexia: Secondary | ICD-10-CM | POA: Diagnosis present

## 2015-07-10 DIAGNOSIS — Z1611 Resistance to penicillins: Secondary | ICD-10-CM | POA: Diagnosis not present

## 2015-07-10 DIAGNOSIS — G4733 Obstructive sleep apnea (adult) (pediatric): Secondary | ICD-10-CM | POA: Diagnosis not present

## 2015-07-10 DIAGNOSIS — G8929 Other chronic pain: Secondary | ICD-10-CM | POA: Diagnosis present

## 2015-07-10 DIAGNOSIS — R0902 Hypoxemia: Secondary | ICD-10-CM

## 2015-07-10 DIAGNOSIS — K219 Gastro-esophageal reflux disease without esophagitis: Secondary | ICD-10-CM | POA: Diagnosis present

## 2015-07-10 DIAGNOSIS — E876 Hypokalemia: Secondary | ICD-10-CM | POA: Diagnosis present

## 2015-07-10 DIAGNOSIS — Z7951 Long term (current) use of inhaled steroids: Secondary | ICD-10-CM | POA: Diagnosis not present

## 2015-07-10 DIAGNOSIS — IMO0002 Reserved for concepts with insufficient information to code with codable children: Secondary | ICD-10-CM | POA: Diagnosis present

## 2015-07-10 DIAGNOSIS — E871 Hypo-osmolality and hyponatremia: Secondary | ICD-10-CM | POA: Diagnosis present

## 2015-07-10 LAB — COMPREHENSIVE METABOLIC PANEL
ALK PHOS: 74 U/L (ref 38–126)
ALT: 43 U/L (ref 14–54)
AST: 42 U/L — ABNORMAL HIGH (ref 15–41)
Albumin: 3.7 g/dL (ref 3.5–5.0)
Anion gap: 12 (ref 5–15)
BUN: 19 mg/dL (ref 6–20)
CALCIUM: 8.5 mg/dL — AB (ref 8.9–10.3)
CO2: 26 mmol/L (ref 22–32)
CREATININE: 0.83 mg/dL (ref 0.44–1.00)
Chloride: 90 mmol/L — ABNORMAL LOW (ref 101–111)
Glucose, Bld: 335 mg/dL — ABNORMAL HIGH (ref 65–99)
Potassium: 3.3 mmol/L — ABNORMAL LOW (ref 3.5–5.1)
SODIUM: 128 mmol/L — AB (ref 135–145)
Total Bilirubin: 0.8 mg/dL (ref 0.3–1.2)
Total Protein: 7.4 g/dL (ref 6.5–8.1)

## 2015-07-10 LAB — URINALYSIS COMPLETE WITH MICROSCOPIC (ARMC ONLY)
BILIRUBIN URINE: NEGATIVE
KETONES UR: NEGATIVE mg/dL
NITRITE: NEGATIVE
PH: 5 (ref 5.0–8.0)
Protein, ur: 100 mg/dL — AB
Specific Gravity, Urine: 1.023 (ref 1.005–1.030)
TRANS EPITHEL UA: 1

## 2015-07-10 LAB — GLUCOSE, CAPILLARY
GLUCOSE-CAPILLARY: 244 mg/dL — AB (ref 65–99)
Glucose-Capillary: 270 mg/dL — ABNORMAL HIGH (ref 65–99)

## 2015-07-10 LAB — LACTIC ACID, PLASMA
LACTIC ACID, VENOUS: 1.6 mmol/L (ref 0.5–2.0)
Lactic Acid, Venous: 2.8 mmol/L (ref 0.5–2.0)

## 2015-07-10 LAB — CBC
HEMATOCRIT: 43.9 % (ref 35.0–47.0)
Hemoglobin: 14.8 g/dL (ref 12.0–16.0)
MCH: 29.7 pg (ref 26.0–34.0)
MCHC: 33.7 g/dL (ref 32.0–36.0)
MCV: 88.2 fL (ref 80.0–100.0)
Platelets: 152 10*3/uL (ref 150–440)
RBC: 4.98 MIL/uL (ref 3.80–5.20)
RDW: 13.1 % (ref 11.5–14.5)
WBC: 12.1 10*3/uL — ABNORMAL HIGH (ref 3.6–11.0)

## 2015-07-10 MED ORDER — ACETAMINOPHEN 650 MG RE SUPP: 650.0000 mg | Freq: Four times a day (QID) | RECTAL | Status: DC | PRN

## 2015-07-10 MED ORDER — IOHEXOL 240 MG/ML SOLN
25.0000 mL | Freq: Once | INTRAMUSCULAR | Status: AC | PRN
  Administered 2015-07-10: 25 mL via ORAL

## 2015-07-10 MED ORDER — HYDROCODONE-ACETAMINOPHEN 5-325 MG PO TABS
1.0000 | ORAL_TABLET | ORAL | Status: DC | PRN
  Administered 2015-07-11 – 2015-07-12 (×2): 1 via ORAL
  Filled 2015-07-10 (×2): qty 1

## 2015-07-10 MED ORDER — GLIMEPIRIDE 2 MG PO TABS: 4.0000 mg | ORAL_TABLET | Freq: Every day | ORAL | Status: DC

## 2015-07-10 MED ORDER — LINAGLIPTIN 5 MG PO TABS
5.0000 mg | ORAL_TABLET | Freq: Every day | ORAL | Status: DC
  Administered 2015-07-11 – 2015-07-13 (×3): 5 mg via ORAL
  Filled 2015-07-10 (×3): qty 1

## 2015-07-10 MED ORDER — HYDRALAZINE HCL 20 MG/ML IJ SOLN: 10.0000 mg | Freq: Four times a day (QID) | INTRAMUSCULAR | Status: DC | PRN

## 2015-07-10 MED ORDER — ACETAMINOPHEN 325 MG PO TABS
650.0000 mg | ORAL_TABLET | Freq: Once | ORAL | Status: AC | PRN
Start: 2015-07-10 — End: 2015-07-10
  Administered 2015-07-10: 650 mg via ORAL
  Filled 2015-07-10: qty 2

## 2015-07-10 MED ORDER — POTASSIUM CHLORIDE 20 MEQ/15ML (10%) PO SOLN
40.0000 meq | Freq: Once | ORAL | Status: AC
  Administered 2015-07-10: 40 meq via ORAL
  Filled 2015-07-10: qty 30

## 2015-07-10 MED ORDER — ONDANSETRON HCL 4 MG PO TABS
4.0000 mg | ORAL_TABLET | Freq: Four times a day (QID) | ORAL | Status: DC | PRN
  Administered 2015-07-11: 13:00:00 4 mg via ORAL
  Filled 2015-07-10: qty 1

## 2015-07-10 MED ORDER — DEXTROSE 5 % IV SOLN
1.0000 g | INTRAVENOUS | Status: DC
Start: 2015-07-10 — End: 2015-07-11
  Administered 2015-07-10: 16:00:00 1 g via INTRAVENOUS
  Filled 2015-07-10 (×2): qty 10

## 2015-07-10 MED ORDER — ASPIRIN EC 81 MG PO TBEC
81.0000 mg | DELAYED_RELEASE_TABLET | Freq: Every day | ORAL | Status: DC
  Administered 2015-07-11 – 2015-07-13 (×3): 81 mg via ORAL
  Filled 2015-07-10 (×3): qty 1

## 2015-07-10 MED ORDER — ZOSTER VACCINE LIVE 19400 UNT/0.65ML ~~LOC~~ SOLR
0.6500 mL | Freq: Once | SUBCUTANEOUS | Status: DC
  Filled 2015-07-10: qty 0.65

## 2015-07-10 MED ORDER — ALBUTEROL SULFATE (2.5 MG/3ML) 0.083% IN NEBU: 2.5000 mg | INHALATION_SOLUTION | Freq: Four times a day (QID) | RESPIRATORY_TRACT | Status: DC | PRN

## 2015-07-10 MED ORDER — GLIPIZIDE ER 5 MG PO TB24
20.0000 mg | ORAL_TABLET | Freq: Every day | ORAL | Status: DC
  Administered 2015-07-11 – 2015-07-13 (×3): 20 mg via ORAL
  Filled 2015-07-10: qty 4
  Filled 2015-07-10: qty 2
  Filled 2015-07-10 (×2): qty 4

## 2015-07-10 MED ORDER — PIPERACILLIN-TAZOBACTAM 3.375 G IVPB
3.3750 g | Freq: Once | INTRAVENOUS | Status: AC
  Administered 2015-07-10: 3.375 g via INTRAVENOUS
  Filled 2015-07-10: qty 50

## 2015-07-10 MED ORDER — MORPHINE SULFATE (PF) 2 MG/ML IV SOLN
1.0000 mg | INTRAVENOUS | Status: DC | PRN
  Administered 2015-07-10 – 2015-07-11 (×2): 1 mg via INTRAVENOUS
  Filled 2015-07-10 (×2): qty 1

## 2015-07-10 MED ORDER — ALUM & MAG HYDROXIDE-SIMETH 200-200-20 MG/5ML PO SUSP: 30.0000 mL | Freq: Four times a day (QID) | ORAL | Status: DC | PRN

## 2015-07-10 MED ORDER — SENNOSIDES-DOCUSATE SODIUM 8.6-50 MG PO TABS: 1.0000 | ORAL_TABLET | Freq: Every evening | ORAL | Status: DC | PRN

## 2015-07-10 MED ORDER — ONDANSETRON HCL 4 MG/2ML IJ SOLN
4.0000 mg | Freq: Four times a day (QID) | INTRAMUSCULAR | Status: DC | PRN
  Administered 2015-07-10 – 2015-07-13 (×4): 4 mg via INTRAVENOUS
  Filled 2015-07-10 (×5): qty 2

## 2015-07-10 MED ORDER — SODIUM CHLORIDE 0.9 % IV BOLUS (SEPSIS)
1000.0000 mL | Freq: Once | INTRAVENOUS | Status: AC
  Administered 2015-07-10: 1000 mL via INTRAVENOUS

## 2015-07-10 MED ORDER — ACETAMINOPHEN 325 MG PO TABS
650.0000 mg | ORAL_TABLET | Freq: Four times a day (QID) | ORAL | Status: DC | PRN
  Administered 2015-07-10 – 2015-07-12 (×2): 650 mg via ORAL
  Filled 2015-07-10 (×2): qty 2

## 2015-07-10 MED ORDER — INSULIN ASPART 100 UNIT/ML ~~LOC~~ SOLN
0.0000 [IU] | Freq: Every day | SUBCUTANEOUS | Status: DC
  Administered 2015-07-10 – 2015-07-11 (×2): 2 [IU] via SUBCUTANEOUS
  Filled 2015-07-10 (×2): qty 2

## 2015-07-10 MED ORDER — ENOXAPARIN SODIUM 40 MG/0.4ML ~~LOC~~ SOLN
40.0000 mg | SUBCUTANEOUS | Status: DC
  Administered 2015-07-10 – 2015-07-12 (×3): 40 mg via SUBCUTANEOUS
  Filled 2015-07-10 (×3): qty 0.4

## 2015-07-10 MED ORDER — INSULIN ASPART 100 UNIT/ML ~~LOC~~ SOLN
0.0000 [IU] | Freq: Three times a day (TID) | SUBCUTANEOUS | Status: DC
  Administered 2015-07-10: 8 [IU] via SUBCUTANEOUS
  Administered 2015-07-11: 17:00:00 3 [IU] via SUBCUTANEOUS
  Administered 2015-07-11 (×2): 5 [IU] via SUBCUTANEOUS
  Administered 2015-07-12: 18:00:00 2 [IU] via SUBCUTANEOUS
  Administered 2015-07-12: 13:00:00 5 [IU] via SUBCUTANEOUS
  Administered 2015-07-12 – 2015-07-13 (×3): 3 [IU] via SUBCUTANEOUS
  Filled 2015-07-10: qty 8
  Filled 2015-07-10 (×2): qty 3
  Filled 2015-07-10: qty 5
  Filled 2015-07-10: qty 3
  Filled 2015-07-10: qty 2
  Filled 2015-07-10: qty 3
  Filled 2015-07-10: qty 8
  Filled 2015-07-10: qty 5

## 2015-07-10 MED ORDER — PROMETHAZINE HCL 25 MG PO TABS
25.0000 mg | ORAL_TABLET | Freq: Three times a day (TID) | ORAL | Status: DC | PRN
  Administered 2015-07-11 – 2015-07-12 (×2): 25 mg via ORAL
  Filled 2015-07-10 (×2): qty 1

## 2015-07-10 MED ORDER — IOHEXOL 300 MG/ML  SOLN
125.0000 mL | Freq: Once | INTRAMUSCULAR | Status: AC | PRN
  Administered 2015-07-10: 125 mL via INTRAVENOUS

## 2015-07-10 MED ORDER — INFLUENZA VAC SPLIT QUAD 0.5 ML IM SUSY
0.5000 mL | PREFILLED_SYRINGE | INTRAMUSCULAR | Status: AC
  Administered 2015-07-13: 12:00:00 0.5 mL via INTRAMUSCULAR
  Filled 2015-07-10: qty 0.5

## 2015-07-10 MED ORDER — SODIUM CHLORIDE 0.9 % IV BOLUS (SEPSIS)
1500.0000 mL | Freq: Once | INTRAVENOUS | Status: AC
  Administered 2015-07-10: 1500 mL via INTRAVENOUS

## 2015-07-10 NOTE — H&P (Addendum)
[facility] - [place] at [facility]   PATIENT NAME: Rachael Stokes    MR#:  [id number]  DATE OF BIRTH:  1951/03/23  DATE OF ADMISSION:  07/10/2015  PRIMARY CARE PHYSICIAN: Daniel Nones, MD   REQUESTING/REFERRING PHYSICIAN: Dr Fanny Bien  CHIEF COMPLAINT:  Right-sided back pain with nausea HISTORY OF PRESENT ILLNESS:  Rachael Stokes  is a 64 y.o. female with a known history of diabetes type 2 without complication and death no who presents with above complaint. Patient reports over the past several days she's had right-sided back pain. She was recently treated for urinary tract infection and was given Bactrim. She completed the 5 day course however continue to have back pain and nausea. In the emergency room she underwent a CT scan which shows right-sided pyelonephritis. She also presented to the emergency room with a fever. She says over the past few day she has had decreased oral intake and not felt well. She was started on Zosyn in the emergency room. Urine and blood cultures have been taken.  PAST MEDICAL HISTORY:   Past Medical History  Diagnosis Date  . Anemia   . Arthritis   . History of chicken pox   . Depression   . Diabetes mellitus without complication (HCC)   . GERD (gastroesophageal reflux disease)   . Allergy   . Hypertension   . History of blood transfusion   . History of urinary tract infection   . Sleep apnea   . Fatty liver     u/s 08/2013  . Chronic pain     Left hip since surgery 2003    PAST SURGICAL HISTORY:   Past Surgical History  Procedure Laterality Date  . Cesarean section    . Total hip arthroplasty Left 2003    SOCIAL HISTORY:   Social History  Substance Use Topics  . Smoking status: Former Games developer  . Smokeless tobacco: Not on file  . Alcohol Use: No    FAMILY HISTORY:   Family History  Problem Relation Age of Onset  . Arthritis Mother   . Hyperlipidemia Mother   . Hypertension Mother   . Mental retardation Mother    . Arthritis Father   . Hyperlipidemia Father   . Hypertension Father   . Diabetes Father   . Cancer Sister 68    Breast cancer    DRUG ALLERGIES:   Allergies  Allergen Reactions  . Clinoril [Sulindac]   . Feldene [Piroxicam]   . Metformin And Related Diarrhea  . Motrin [Ibuprofen]   . Shellfish Allergy      REVIEW OF SYSTEMS:  CONSTITUTIONAL: Positive fever and generalized weakness and chills. Poor by mouth intake EYES: No blurred or double vision.  EARS, NOSE, AND THROAT: No tinnitus or ear pain. She is deaf RESPIRATORY: No cough, shortness of breath, wheezing or hemoptysis.  CARDIOVASCULAR: No chest pain, orthopnea, edema.  GASTROINTESTINAL: No nausea, vomiting, diarrhea or abdominal pain.  GENITOURINARY: Positive dysuria and right-sided back pain ENDOCRINE: No polyuria, nocturia,  HEMATOLOGY: No anemia, easy bruising or bleeding SKIN: No rash or lesion. MUSCULOSKELETAL: No joint pain or arthritis.   NEUROLOGIC: No tingling, numbness, weakness.  PSYCHIATRY: No anxiety or depression.   MEDICATIONS AT HOME:          Aspirin 81 mg daily  DuoNeb's 1 inhalation every 4 hours  EpiPen when necessary  Pro-air 2 inhalations every 6 hours when necessary  Flonase 2 sprays both nostrils daily  Tylenol 650 every 4 hours when  necessary  VOLTAREN topical gel twice a day  Glucotrol XL 10 mg 2 tablets daily  Victoza 1.2 mg subcutaneous daily  Norco 5/325 one tablet every 8 hours when necessary  HCTZ 25 daily  TriCor 145 daily                                                                                                VITAL SIGNS:  Blood pressure 167/78, pulse 95, temperature 102.5 F (39.2 C), temperature source Oral, resp. rate 22, height  (1.702 m), weight 107.956 kg (238 lb), SpO2 97 %.  PHYSICAL EXAMINATION:  GENERAL:  64 y.o.-year-old patient lying in the bed with no acute distress.  EYES: Pupils equal, round, reactive to light and accommodation.  No scleral icterus. Extraocular muscles intact.  HEENT: Head atraumatic, normocephalic. Oropharynx  clear.  NECK:  Supple, no jugular venous distention. No thyroid enlargement, no tenderness.  LUNGS: Normal breath sounds bilaterally, no wheezing, rales,rhonchi or crepitation. No use of accessory muscles of respiration.  CARDIOVASCULAR: S1, S2 normal. No murmurs, rubs, or gallops.  ABDOMEN: Soft, nontender, nondistended. Bowel sounds present. No organomegaly or mass. Right CVA tenderness EXTREMITIES: No pedal edema, cyanosis, or clubbing.  NEUROLOGIC: Cranial nerves II through XII are grossly intact. No focal deficits. PSYCHIATRIC: The patient is alert and oriented x 3.  SKIN: No obvious rash, lesion, or ulcer.   LABORATORY PANEL:   CBC  Recent Labs Lab 07/10/15 1100  WBC 12.1*  HGB 14.8  HCT 43.9  PLT 152   ------------------------------------------------------------------------------------------------------------------  Chemistries   Recent Labs Lab 07/10/15 1100  NA 128*  K 3.3*  CL 90*  CO2 26  GLUCOSE 335*  BUN 19  CREATININE 0.83  CALCIUM 8.5*  AST 42*  ALT 43  ALKPHOS 74  BILITOT 0.8   ------------------------------------------------------------------------------------------------------------------  Cardiac Enzymes No results for input(s): TROPONINI in the last 168 hours. ------------------------------------------------------------------------------------------------------------------  RADIOLOGY:  Dg Chest 2 View  07/10/2015  CLINICAL DATA:  64 year old female with history of fever, weakness, nausea and vomiting. Being treated for urinary tract infection since 06/10/2015 (no resolution of symptoms). EXAM: CHEST  2 VIEW COMPARISON:  Chest x-ray 05/03/2012. FINDINGS: Lung volumes are normal. No consolidative airspace disease. No pleural effusions. Cephalization of the pulmonary vasculature, without frank pulmonary edema. Heart size is mildly enlarged. Upper  mediastinal contours are within normal limits. Atherosclerosis in the thoracic aorta. IMPRESSION: 1. Mild cardiomegaly with pulmonary venous congestion, but no frank pulmonary edema. 2. Atherosclerosis. Electronically Signed   By: Trudie Reed M.D.   On: 07/10/2015 12:23   Ct Abdomen Pelvis W Contrast  07/10/2015  CLINICAL DATA:  64 year old female with acute right abdominal and pelvic pain, fever and nausea for 3 weeks. EXAM: CT ABDOMEN AND PELVIS WITH CONTRAST TECHNIQUE: Multidetector CT imaging of the abdomen and pelvis was performed using the standard protocol following bolus administration of intravenous contrast. CONTRAST:  OMNIPAQUE IOHEXOL 300 MG/ML  SOLN COMPARISON:  None. FINDINGS: Lower chest:  Minimal bibasilar atelectasis/scarring noted. Hepatobiliary: Hepatic steatosis identified without focal hepatic lesion. The gallbladder is unremarkable. There is no evidence of biliary dilatation.  Pancreas: Unremarkable Spleen: Unremarkable Adrenals/Urinary Tract: Wedge-shaped areas of differential perfusion within the right kidney and mild right perinephric stranding are compatible with pyelonephritis. There is no evidence of hydronephrosis or mass. The left kidney, adrenal glands and bladder are unremarkable. Stomach/Bowel: Colonic diverticulosis noted without diverticulitis. There is no evidence of bowel obstruction or focal bowel wall thickening. The appendix is normal. Vascular/Lymphatic: No enlarged lymph nodes. Abdominal aortic atherosclerotic calcifications noted without aneurysm. Reproductive: Small calcified fibroids are noted. The adnexal regions are unremarkable. Other: No free fluid, abscess or pneumoperitoneum. Musculoskeletal: No acute or suspicious abnormality. Mild degenerative changes in the lumbar spine again noted. IMPRESSION: Right pyelonephritis.  No evidence of hydronephrosis or abscess. Hepatic steatosis. Electronically Signed   By: Harmon PierJeffrey  Hu M.D.   On: 07/10/2015 12:55     EKG:     IMPRESSION AND PLAN:  64 year old female who was recently treated for urinary tract infection with Bactrim presents with dysuria and back pain along with fever and found to have right pyelonephritis on CT scan.  1. Sepsis: Patient presents with fever and leukocytosis along with elevated lactic acid. Sepsis secondary to right pyelonephritis. Patient will be given IV fluids and continued on IV antibiotics. Blood and urine cultures have been ordered.  2. Pyelonephritis, right side: patient was on Zosyn in the ER I have changes to Rocephin. Follow up on urine culture. Continue with supportive treatment for nausea and pain.  3. Hypokalemia: Potassium will be repleted and rechecked in a.m.  4. Diabetes type 2 without consultation: Continue sliding scale insulin, ADA diet, Glucotrol and toes.  5. Hyponatremia: This is secondary to poor by mouth intake due to not feeling well. IV fluids have been started. Sodium will be rechecked in the a.m. I will hold HCTZ for now.  6. Essential hypertension: Due to hyponatremia I have discontinued HCTZ for now. Continue to monitor blood pressure and add hydralazine when necessary  7. Deaf: She can read lips.   All the records are reviewed and case discussed with ED provider. Management plans discussed with the patient and she is in agreement.  CODE STATUS: FULL  TOTAL TIME TAKING CARE OF THIS PATIENT: 45 minutes.    Cloee Dunwoody M.D on 07/10/2015 at 1:17 PM  Between 7am to 6pm - Pager - [phone] After 6pm go to [email or web address] - password [email or web address]  [facility]  Office  [phone]  CC: Primary care physician; Daniel NonesBert Klein, MD

## 2015-07-10 NOTE — ED Notes (Signed)
[facility]/o fever, weakness, N/V .the patient has been treated for UTI since 9/14 with sx not resolving.the patient is a/ox3.the patient is hearing impaired..Rachael Stokes

## 2015-07-10 NOTE — ED Provider Notes (Signed)
[facility] Emergency Department Provider Note REMINDER - THIS NOTE IS NOT A FINAL MEDICAL RECORD UNTIL IT IS SIGNED. UNTIL THEN, THE CONTENT BELOW MAY REFLECT INFORMATION FROM A DOCUMENTATION TEMPLATE, NOT THE ACTUAL PATIENT VISIT. ____________________________________________  Time seen: Approximately 11:30 AM  I have reviewed the triage vital signs and the nursing notes.  The patient has very difficulty hearing, I did offer her an interpreter however she does not use sign language per her family who are assisting. She does read lips, and is able to give report by writing as well.  HISTORY  Chief Complaint Fever; Emesis; and Urinary Frequency    HPI Rachael Stokes is a 64 y.o. female who presents with pain in her right back, fever, and vomiting. She reports that about 2 weeks ago she was diagnosed with urinary tract infection completed a course of Bactrim. Her symptoms came back a couple days ago when she's been having increasing pain in the right back, nausea, fevers and chills at home. She has vomited a couple times and feels dehydrated. No chest pain or trouble breathing. She has not had a cough. No headache or neck pain.  She does report pain along the right flank which is sharp, and aggravating.   Past Medical History  Diagnosis Date  . Anemia   . Arthritis   . History of chicken pox   . Depression   . Diabetes mellitus without complication (HCC)   . GERD (gastroesophageal reflux disease)   . Allergy   . Hypertension   . History of blood transfusion   . History of urinary tract infection   . Sleep apnea   . Fatty liver     u/s 08/2013  . Chronic pain     Left hip since surgery 2003    Patient Active Problem List   Diagnosis Date Noted  . Pyelonephritis 07/10/2015  . Right facial swelling 04/21/2014  . Rash and nonspecific skin eruption 04/21/2014  . Right wrist fracture 01/23/2014  . Diabetes mellitus type 2, uncontrolled (HCC)  01/12/2014  . Chronic pain 01/12/2014  . Screening for breast cancer 01/12/2014    Past Surgical History  Procedure Laterality Date  . Cesarean section    . Total hip arthroplasty Left 2003    Current Outpatient Rx  Name  Route  Sig  Dispense  Refill  . acetaminophen (TYLENOL) 650 MG CR tablet   Oral   Take 650 mg by mouth every 8 (eight) hours as needed for pain.         Marland Kitchen albuterol (PROVENTIL HFA;VENTOLIN HFA) 108 (90 BASE) MCG/ACT inhaler   Inhalation   Inhale 2 puffs into the lungs every 6 (six) hours as needed for wheezing or shortness of breath.         Marland Kitchen aspirin 81 MG tablet   Oral   Take 81 mg by mouth daily.         . diclofenac sodium (VOLTAREN) 1 % GEL   Topical   Apply 2 g topically 2 (two) times daily.   100 g   5   . EPINEPHrine (EPI-PEN) 0.3 mg/0.3 mL SOAJ injection   Intramuscular   Inject 0.3 mg into the muscle once.         Marland Kitchen FLUTICASONE PROPIONATE  HFA IN   Inhalation   Inhale 50 mcg into the lungs 2 (two) times daily.         Marland Kitchen glimepiride (AMARYL) 4 MG tablet  One tablet 2 times a day   60 tablet   6   . hydrochlorothiazide (HYDRODIURIL) 25 MG tablet   Oral   Take 25 mg by mouth. Take 1 tablet daily         . HYDROcodone-acetaminophen (NORCO/VICODIN) 5-325 MG per tablet   Oral   Take 1 tablet by mouth every 12 (twelve) hours as needed.   60 tablet   0   . ipratropium-albuterol (DUONEB) 0.5-2.5 (3) MG/3ML SOLN   Nebulization   Take 3 mLs by nebulization every 4 (four) hours as needed.         Marland Kitchen levofloxacin (LEVAQUIN) 500 MG tablet   Oral   Take 1 tablet (500 mg total) by mouth daily. Take 1 tablet daily for 10 days.   10 tablet   0   . linagliptin (TRADJENTA) 5 MG TABS tablet   Oral   Take 1 tablet (5 mg total) by mouth daily.   30 tablet   0     NEEDS OFFICE VISIT PRIOR TO FURTHER REFILLS, PLEAS ...   . predniSONE (DELTASONE) 10 MG tablet      Take 60 mg (6 tablets) on the first day then decrease by 10  mg (1 tablet) daily until done.   21 tablet   0   . promethazine (PHENERGAN) 25 MG tablet   Oral   Take 1 tablet (25 mg total) by mouth every 8 (eight) hours as needed for nausea or vomiting.   30 tablet   0   . ranitidine (ZANTAC) 150 MG tablet   Oral   Take 150 mg by mouth 2 (two) times daily as needed.          Marland Kitchen SALINE NASAL SPRAY NA   Nasal   Place into the nose.         . zolpidem (AMBIEN) 10 MG tablet   Oral   Take 10 mg by mouth. Take 1 tablet by mouth at bedtime as needed         . zoster vaccine live, PF, (ZOSTAVAX) 16109 UNT/0.65ML injection   Subcutaneous   Inject 19,400 Units into the skin once.   1 each   0     Allergies Clinoril; Feldene; Metformin and related; Motrin; and Shellfish allergy  Family History  Problem Relation Age of Onset  . Arthritis Mother   . Hyperlipidemia Mother   . Hypertension Mother   . Mental retardation Mother   . Arthritis Father   . Hyperlipidemia Father   . Hypertension Father   . Diabetes Father   . Cancer Sister 36    Breast cancer    Social History Social History  Substance Use Topics  . Smoking status: Former Games developer  . Smokeless tobacco: None  . Alcohol Use: No    Review of Systems Constitutional: See history of present illness Eyes: No visual changes. ENT: No sore throat. Cardiovascular: Denies chest pain. Respiratory: Denies shortness of breath. Gastrointestinal: See history of present illness Genitourinary: Negative for dysuria. Musculoskeletal: Burning pain with urination Skin: Negative for rash. Neurological: Negative for headaches, focal weakness or numbness.  10-point ROS otherwise negative.  ____________________________________________   PHYSICAL EXAM:  VITAL SIGNS: ED Triage Vitals  Enc Vitals Group     BP 07/10/15 1046 134/69 mmHg     Pulse Rate 07/10/15 1046 110     Resp 07/10/15 1046 22     Temp 07/10/15 1046 102.5 F (39.2 C)     Temp Source 07/10/15 1046  Oral     SpO2  07/10/15 1046 92 %     Weight 07/10/15 1046 237 lb (107.502 kg)     Height 07/10/15 1046  (1.702 m)     Head Cir --      Peak Flow --      Pain Score 07/10/15 1047 9     Pain Loc --      Pain Edu? --      Excl. in GC? --    Constitutional: Alert and oriented. Well appearing and appears in moderate pain. Eyes: Conjunctivae are normal. PERRL. EOMI. patient is extremely hard of hearing Head: Atraumatic. Nose: No congestion/rhinnorhea. Mouth/Throat: Mucous membranes are quite dry.  Oropharynx non-erythematous. Neck: No stridor.   Cardiovascular: Tachycardic rate, regular rhythm. Grossly normal heart sounds.  Good peripheral circulation. Respiratory: Normal respiratory effort.  No retractions. Lungs CTAB. Gastrointestinal: Soft and nontender except for moderate discomfort in the right flank and right lower quadrant. No distention. No abdominal bruits. Moderate right CVA tenderness. Musculoskeletal: No lower extremity tenderness.  No joint effusions. Neurologic:  Normal speech and language. No gross focal neurologic deficits are appreciated. Skin:  Skin is warm, dry and intact. No rash noted. Psychiatric: Mood and affect are normal. behavior is normal.  ____________________________________________   LABS (all labs ordered are listed, but only abnormal results are displayed)  Labs Reviewed  COMPREHENSIVE METABOLIC PANEL - Abnormal; Notable for the following:    Sodium 128 (*)    Potassium 3.3 (*)    Chloride 90 (*)    Glucose, Bld 335 (*)    Calcium 8.5 (*)    AST 42 (*)    All other components within normal limits  LACTIC ACID, PLASMA - Abnormal; Notable for the following:    Lactic Acid, Venous 2.8 (*)    All other components within normal limits  CBC - Abnormal; Notable for the following:    WBC 12.1 (*)    All other components within normal limits  URINALYSIS COMPLETEWITH MICROSCOPIC ([facility] ONLY) - Abnormal; Notable for the following:    Color, Urine AMBER (*)     APPearance CLOUDY (*)    Glucose, UA >500 (*)    Hgb urine dipstick 1+ (*)    Protein, ur 100 (*)    Leukocytes, UA 2+ (*)    Bacteria, UA MANY (*)    Squamous Epithelial / LPF TOO NUMEROUS TO COUNT (*)    All other components within normal limits  CULTURE, BLOOD (ROUTINE X 2)  CULTURE, BLOOD (ROUTINE X 2)  URINE CULTURE  LACTIC ACID, PLASMA   ____________________________________________  EKG  ED ECG REPORT I, Tongela Encinas, the attending physician, personally viewed and interpreted this ECG.  Date: 07/10/2015 EKG Time: 11:30 Rate: 105 Rhythm: Sinus tachycardia QRS Axis: normal Intervals: normal ST/T Wave abnormalities: normal Conduction Disutrbances: none Narrative Interpretation: Sinus tachycardia, no acute ischemic changes  ____________________________________________  RADIOLOGY  IMPRESSION: Right pyelonephritis. No evidence of hydronephrosis or abscess.  Hepatic steatosis.  CLINICAL DATA: 64 year old female with history of fever, weakness, nausea and vomiting. Being treated for urinary tract infection since 06/10/2015 (no resolution of symptoms).  EXAM: CHEST 2 VIEW  COMPARISON: Chest x-ray 05/03/2012.  FINDINGS: Lung volumes are normal. No consolidative airspace disease. No pleural effusions. Cephalization of the pulmonary vasculature, without frank pulmonary edema. Heart size is mildly enlarged. Upper mediastinal contours are within normal limits. Atherosclerosis in the thoracic aorta.  IMPRESSION: 1. Mild cardiomegaly with pulmonary venous congestion, but no frank pulmonary edema.  2. Atherosclerosis. ____________________________________________   PROCEDURES  Procedure(s) performed: None  Critical Care performed: No  ____________________________________________   INITIAL IMPRESSION / ASSESSMENT AND PLAN / ED COURSE  Pertinent labs & imaging results that were available during my care of the patient were reviewed by me and considered in  my medical decision making (see chart for details).  Patient presents with fever, tachycardia, and recent urinary tract infection now with flank pain. All these are concerning for possible pyelonephritis though she does also have some focal tenderness the right lower quadrant as well raising suspicion for possible appendicitis or other acute intra-abdominal infection. We'll treat her with early antibiotics, goal-directed therapy, IV fluids aggressively, and CT pelvis as we further evaluate for etiology of fever.  ----------------------------------------- 1:33 PM on 07/10/2015 -----------------------------------------  Patient reexamined and she reports her pain is improved with morphine, overall she does appear to be improved with heart rate steadily improving with fluid resuscitation. CT scan indicates pyelonephritis which correlates well clinically. Have initiated the patient on broad-spectrum Zosyn for intra-abdominal infection at this time. Ongoing care and admission to hospital service plan at this time. Overall condition improved.  ____________________________________________   FINAL CLINICAL IMPRESSION(S) / ED DIAGNOSES  Final diagnoses:  Sepsis, due to unspecified organism [facility](HCC)  Pyelonephritis      Sharyn CreamerMark Jemmie Ledgerwood, MD 07/10/15 1333

## 2015-07-10 NOTE — ED Notes (Signed)
Pt back from DG chest and CT

## 2015-07-10 NOTE — ED Notes (Signed)
Communicated crit lab result to [facility], Charity fundraiserN.

## 2015-07-10 NOTE — ED Notes (Signed)
Patient transported to X-ray 

## 2015-07-10 NOTE — Plan of Care (Addendum)
Problem: Discharge Progression Outcomes Goal: Other Discharge Outcomes/Goals Outcome: Progressing Plan of care progress to goal: Pt has very little appetite IV ABX infusing. C/o pain in back, morphine given PRN. zofran given PRN for nausea. Pt stated relief. Grandson at bedside on admission. Pt with a 103 temp, tylenol given. Will reassess.

## 2015-07-10 NOTE — ED Notes (Signed)
CRITICAL VALUE ALERT  Critical value received:  Lactic 2.8  Date of notification:  07/10/2015  Time of notification:  11:53  Critical value read back:Yes.    Nurse who received alert:  Art therapistAmber RN  MD notified (1st page):  Quale  Time of first page:  11:53  MD notified (2nd page):  Time of second page:  Responding MD:  Fanny BienQuale  Time MD responded:  11:53

## 2015-07-11 ENCOUNTER — Inpatient Hospital Stay: Payer: Medicare PPO

## 2015-07-11 LAB — BASIC METABOLIC PANEL
ANION GAP: 8 (ref 5–15)
BUN: 15 mg/dL (ref 6–20)
CHLORIDE: 100 mmol/L — AB (ref 101–111)
CO2: 28 mmol/L (ref 22–32)
Calcium: 8.1 mg/dL — ABNORMAL LOW (ref 8.9–10.3)
Creatinine, Ser: 0.64 mg/dL (ref 0.44–1.00)
GFR calc Af Amer: >60 mL/min (ref >60)
GLUCOSE: 248 mg/dL — AB (ref 65–99)
POTASSIUM: 3.4 mmol/L — AB (ref 3.5–5.1)
Sodium: 136 mmol/L (ref 135–145)

## 2015-07-11 LAB — CBC
HEMATOCRIT: 39.1 % (ref 35.0–47.0)
HEMOGLOBIN: 13.5 g/dL (ref 12.0–16.0)
MCH: 30.7 pg (ref 26.0–34.0)
MCHC: 34.5 g/dL (ref 32.0–36.0)
MCV: 88.9 fL (ref 80.0–100.0)
Platelets: 133 10*3/uL — ABNORMAL LOW (ref 150–440)
RBC: 4.4 MIL/uL (ref 3.80–5.20)
RDW: 13.4 % (ref 11.5–14.5)
WBC: 8.9 10*3/uL (ref 3.6–11.0)

## 2015-07-11 LAB — GLUCOSE, CAPILLARY
GLUCOSE-CAPILLARY: 236 mg/dL — AB (ref 65–99)
GLUCOSE-CAPILLARY: 221 mg/dL — AB (ref 65–99)
Glucose-Capillary: 186 mg/dL — ABNORMAL HIGH (ref 65–99)
Glucose-Capillary: 209 mg/dL — ABNORMAL HIGH (ref 65–99)

## 2015-07-11 MED ORDER — IPRATROPIUM-ALBUTEROL 0.5-2.5 (3) MG/3ML IN SOLN
3.0000 mL | Freq: Four times a day (QID) | RESPIRATORY_TRACT | Status: DC
  Administered 2015-07-11 – 2015-07-13 (×7): 3 mL via RESPIRATORY_TRACT
  Filled 2015-07-11 (×10): qty 3

## 2015-07-11 MED ORDER — POTASSIUM CHLORIDE 20 MEQ/15ML (10%) PO SOLN
20.0000 meq | Freq: Two times a day (BID) | ORAL | Status: DC
  Administered 2015-07-11 – 2015-07-12 (×3): 20 meq via ORAL
  Filled 2015-07-11 (×8): qty 15

## 2015-07-11 MED ORDER — PIPERACILLIN-TAZOBACTAM 3.375 G IVPB
3.3750 g | Freq: Three times a day (TID) | INTRAVENOUS | Status: DC
  Administered 2015-07-11 – 2015-07-13 (×7): 3.375 g via INTRAVENOUS
  Filled 2015-07-11 (×9): qty 50

## 2015-07-11 NOTE — Progress Notes (Addendum)
While assessing patient during medication administration. RN noticed periods of patient not breathing while sleeping. Has hx of sleep apnea. O2 checked 50%. Immediately placed patient on 4 liters [place]. Oxygen increased to 96%. Paged Dr. Judithann SheenSparks and requested CPAP for patient while sleeping. Called placed to RT to inform of situation and order.

## 2015-07-11 NOTE — Progress Notes (Addendum)
64 year old female admitted for pyelonephritis, UTI. Chest X ray completed today with no significant findings. Oxygen low today 89-90% placed on 2 liters while with RT, oxygen increased to 96%. SVNs were administered today by RT as ordered by Dr. Judithann SheenSparks. Also supplemented PO Potassium which patient was able to keep down . Reported nausea multiple times throughout shift, (no vomiting) RN first tried PO Zofran. Pt noticed improvement for short period but reported nausea within a few hours. RN then administered PO Phenergan. Pt noticed significant improvement after administration of second nausea medication. Has not tolerated diet well. Able to stay hydrated with water and diet ginger ale. Pt did experience 1 soft BM and 1 loose stool today. Charge RN was notified. Documentation in place. Educated patient on Pyelonephritis, UTI and Zosyn. Handouts provider. Understanding voiced.   Problem: Discharge Progression Outcomes Goal: Other Discharge Outcomes/Goals Plan of care progress to goal: Pain-denies pain this shift Hemodynamically: no temp at this time Diet: poor appetite Activity: standby assist to bathroom-[facility]

## 2015-07-11 NOTE — Progress Notes (Signed)
Rachael Stokes is a 64 y.o. female  <principal problem not specified>   SUBJECTIVE:   Pt still with back pain, malaise, and anorexia. Afebrile. No vomiting or diarrhea. Some SOB. Oxygen low this AM.  ______________________________________________________________________  ROS: Review of systems is unremarkable for any active cardiac,respiratory, GI, GU, hematologic, neurologic or psychiatric systems, 10 systems reviewed.  @CMEDLIST @  Past Medical History  Diagnosis Date  . Anemia   . Arthritis   . History of chicken pox   . Depression   . Diabetes mellitus without complication (HCC)   . GERD (gastroesophageal reflux disease)   . Allergy   . Hypertension   . History of blood transfusion   . History of urinary tract infection   . Sleep apnea   . Fatty liver     u/s 08/2013  . Chronic pain     Left hip since surgery 2003    Past Surgical History  Procedure Laterality Date  . Cesarean section    . Total hip arthroplasty Left 2003    PHYSICAL EXAM:  BP 137/69 mmHg  Pulse 97  Temp(Src) 98.3 F (36.8 C) (Oral)  Resp 20  Ht 5\' 6"  (1.676 m)  Wt 111.766 kg (246 lb 6.4 oz)  BMI 39.79 kg/m2  SpO2 89%  Wt Readings from Last 3 Encounters:  07/10/15 111.766 kg (246 lb 6.4 oz)            Constitutional: NAD Neck: supple, no thyromegaly Respiratory: scattered wheezes and rhonchi Cardiovascular: RRR, no murmur, no gallop Abdomen: soft, good BS, nontender Extremities: no edema Neuro: alert and oriented, no focal motor or sensory deficits  ASSESSMENT/PLAN:  Labs and imaging studies were reviewed  Will change to IV Zosyn. CXR today. Supplement K+. Add SVN's. F/u on cultures. Repeat labs in AM.

## 2015-07-11 NOTE — Plan of Care (Signed)
Problem: Discharge Progression Outcomes Goal: Other Discharge Outcomes/Goals Plan of care progress to goal: Pain-denies pain this shift Hemodynamically: no temp at this time Diet: poor appetite Activity: standby assist to bathroom

## 2015-07-11 NOTE — Progress Notes (Signed)
Pt refused svn.  She removed cpap mask and refused to allow me to put her back on it. She also refused the nasal cannula. RN is aware.

## 2015-07-12 LAB — CBC WITH DIFFERENTIAL/PLATELET
BASOS ABS: 0 10*3/uL (ref 0–0.1)
Basophils Relative: 0 %
EOS PCT: 2 %
Eosinophils Absolute: 0.2 10*3/uL (ref 0–0.7)
HCT: 39.9 % (ref 35.0–47.0)
Hemoglobin: 13.7 g/dL (ref 12.0–16.0)
LYMPHS PCT: 16 %
Lymphs Abs: 1.5 10*3/uL (ref 1.0–3.6)
MCH: 30.4 pg (ref 26.0–34.0)
MCHC: 34.4 g/dL (ref 32.0–36.0)
MCV: 88.6 fL (ref 80.0–100.0)
Monocytes Absolute: 0.9 10*3/uL (ref 0.2–0.9)
Monocytes Relative: 10 %
NEUTROS ABS: 6.6 10*3/uL — AB (ref 1.4–6.5)
NEUTROS PCT: 72 %
PLATELETS: 148 10*3/uL — AB (ref 150–440)
RBC: 4.51 MIL/uL (ref 3.80–5.20)
RDW: 13.5 % (ref 11.5–14.5)
WBC: 9.2 10*3/uL (ref 3.6–11.0)

## 2015-07-12 LAB — BASIC METABOLIC PANEL
Anion gap: 7 (ref 5–15)
BUN: 15 mg/dL (ref 6–20)
CO2: 30 mmol/L (ref 22–32)
Calcium: 8.2 mg/dL — ABNORMAL LOW (ref 8.9–10.3)
Chloride: 99 mmol/L — ABNORMAL LOW (ref 101–111)
Creatinine, Ser: 0.53 mg/dL (ref 0.44–1.00)
GFR calc Af Amer: >60 mL/min (ref >60)
GLUCOSE: 197 mg/dL — AB (ref 65–99)
POTASSIUM: 3.8 mmol/L (ref 3.5–5.1)
Sodium: 136 mmol/L (ref 135–145)

## 2015-07-12 LAB — URINE CULTURE

## 2015-07-12 LAB — GLUCOSE, CAPILLARY
GLUCOSE-CAPILLARY: 170 mg/dL — AB (ref 65–99)
GLUCOSE-CAPILLARY: 178 mg/dL — AB (ref 65–99)
GLUCOSE-CAPILLARY: 186 mg/dL — AB (ref 65–99)
Glucose-Capillary: 214 mg/dL — ABNORMAL HIGH (ref 65–99)

## 2015-07-12 MED ORDER — BUDESONIDE 0.25 MG/2ML IN SUSP
0.2500 mg | Freq: Two times a day (BID) | RESPIRATORY_TRACT | Status: DC
  Administered 2015-07-12 – 2015-07-13 (×3): 0.25 mg via RESPIRATORY_TRACT
  Filled 2015-07-12 (×3): qty 2

## 2015-07-12 NOTE — Progress Notes (Signed)
cpap refused. [place] flow increased to 5 lpm due to desats during sleep

## 2015-07-12 NOTE — Progress Notes (Addendum)
Patient wearing cpap now, oxygen saturation is 91%. Pt c/o back pain (7/10), PRN IV pain medication given.

## 2015-07-12 NOTE — Evaluation (Signed)
Physical Therapy Evaluation Patient Details Name: Rachael Stokes MRN: [id number] DOB: [birth year] Today's Date: 07/12/2015   History of Present Illness  Pt was having R back pain, found to be pylonephritis  Clinical Impression  At times it was difficult to communicate but between writing and lip reading she was able to understand.  Pt able to walk w/o AD for some of her 350 ft of ambulation, but did seem to like the walker too - though she was adamant about not having to use one at home.  Pt will need some minimal work on balance and ambulation w/o AD but likely will not need PT at home.     Follow Up Recommendations  (pt does not seem interested in HHPT, wants to start at a gym)    Equipment Recommendations  Rolling walker with 5" wheels (difficult to assess if she truly needs/wants one)    Recommendations for Other Services       Precautions / Restrictions Precautions Precautions: Fall Restrictions Weight Bearing Restrictions: No      Mobility  Bed Mobility Overal bed mobility: Independent             General bed mobility comments: pt able to get to EOB w/o issue  Transfers Overall transfer level: Independent Equipment used:  (pt able to rise and maintain balance w/o AD)                Ambulation/Gait Ambulation/Gait assistance: Modified independent (Device/Increase time) Ambulation Distance (Feet): 350 Feet Assistive device: Rolling walker (2 wheeled)       General Gait Details: Pt walks ~50 ft w/o AD and 300 ft with.  She is not reliant on the walker but does report that she feels like gives her good support and is more confident.  Stairs Stairs: Yes Stairs assistance: Modified independent (Device/Increase time) Stair Management: One rail Right Number of Stairs: 6 General stair comments: Pt does not have steps to enter the home, but did well with them during PT eval anyway  Wheelchair Mobility    Modified Rankin (Stroke Patients Only)        Balance                                             Pertinent Vitals/Pain Pain Assessment: 0-10 Pain Score: 4     Home Living Family/patient expects to be discharged to:: Private residence Living Arrangements: Alone   Type of Home:  (condo) Home Access: Level entry       Home Equipment: None      Prior Function Level of Independence: Independent         Comments: Pt reports that she is able to do all she needs w/o assist     Hand Dominance        Extremity/Trunk Assessment   Upper Extremity Assessment: Overall WFL for tasks assessed           Lower Extremity Assessment: Overall WFL for tasks assessed         Communication   Communication: Deaf (used writing and lip-reading effectively)  Cognition Arousal/Alertness: Awake/alert Behavior During Therapy: WFL for tasks assessed/performed Overall Cognitive Status: Within Functional Limits for tasks assessed                      General Comments      Exercises  Assessment/Plan    PT Assessment  (likely will need only in house PT to work on non-AD gait)  PT Diagnosis Difficulty walking;Generalized weakness   PT Problem List    PT Treatment Interventions     PT Goals (Current goals can be found in the Care Plan section) Acute Rehab PT Goals Patient Stated Goal: "I want to go home" PT Goal Formulation: With patient Time For Goal Achievement: 07/26/15 Potential to Achieve Goals: Good    Frequency     Barriers to discharge        Co-evaluation               End of Session Equipment Utilized During Treatment: Gait belt Activity Tolerance: Patient tolerated treatment well Patient left: with bed alarm set Nurse Communication: Mobility status         Time: [id number] PT Time Calculation (min) (ACUTE ONLY): 19 min   Charges:   PT Evaluation $Initial PT Evaluation Tier I: 1 Procedure     PT G Codes:       Loran SentersGalen Jaleia Hanke, PT, DPT [phone]   Malachi ProGalen R Darcey Cardy 07/12/2015, 3:52 PM

## 2015-07-12 NOTE — Progress Notes (Signed)
RT notified me that patient would not allow him to replace cpap, when I entered room patient was wearing cpap, O2 sats 91%.

## 2015-07-12 NOTE — Plan of Care (Signed)
Problem: Discharge Progression Outcomes Goal: Other Discharge Outcomes/Goals Outcome: Progressing Patient reports feeling much better today, feeling like her appetite is getting better, continues to complain of back pain PRN medications help, VSS, continues to get IV abx

## 2015-07-12 NOTE — Progress Notes (Signed)
Rachael Stokes is a 64 y.o. female  <principal problem not specified>   SUBJECTIVE:  Pt in NAD. VSS, afebrile. Urine culture positive for E.Coli. Blood cultures NTD. Still with SOB/DOE and nausea with anorexia.  ______________________________________________________________________  ROS: Review of systems is unremarkable for any active cardiac,respiratory, GI, GU, hematologic, neurologic or psychiatric systems, 10 systems reviewed.  @CMEDLIST @  Past Medical History  Diagnosis Date  . Anemia   . Arthritis   . History of chicken pox   . Depression   . Diabetes mellitus without complication (HCC)   . GERD (gastroesophageal reflux disease)   . Allergy   . Hypertension   . History of blood transfusion   . History of urinary tract infection   . Sleep apnea   . Fatty liver     u/s 08/2013  . Chronic pain     Left hip since surgery 2003    Past Surgical History  Procedure Laterality Date  . Cesarean section    . Total hip arthroplasty Left 2003    PHYSICAL EXAM:  BP 156/63 mmHg  Pulse 85  Temp(Src) 98.7 F (37.1 C) (Oral)  Resp 20  Ht 5\' 6"  (1.676 m)  Wt 111.766 kg (246 lb 6.4 oz)  BMI 39.79 kg/m2  SpO2 91%  Wt Readings from Last 3 Encounters:  07/10/15 111.766 kg (246 lb 6.4 oz)            Constitutional: NAD Neck: supple, no thyromegaly Respiratory: CTA, no rales or wheezes Cardiovascular: RRR, no murmur, no gallop Abdomen: soft, good BS, nontender Extremities: no edema Neuro: alert and oriented, no focal motor or sensory deficits  ASSESSMENT/PLAN:  Labs and imaging studies were reviewed  Will add Pulmicort. Continue IV ABX. PT eval today. Repeat labs in AM.

## 2015-07-12 NOTE — Plan of Care (Signed)
Problem: Discharge Progression Outcomes Goal: Other Discharge Outcomes/Goals Outcome: Progressing Plan of care progress to goals: Discharge plan in place- pt is from home and would like to be discharged back to home when stable. Pain- pt c/o back pain (7/10), improved with PRN IV pain medication. Hemodynamically stable- afebrile this shift, otherwise VSS. Potassium low 07/11/2015 AM, PO potassium supplement ordered and given, continue to monitor labs.  Complications controlled- afebrile this shift. Pt tolerated cpap this shift, maintained oxygen saturation 91% and 92% this shift while wearing cpap.  Diet- per report poor appetite, no c/o nausea or vomiting this shift.  Activity appropriate for discharge- High fall risk with bed alarm activated. [facility] Pt states that she is deaf but able to read lips, speak clear and face patient.

## 2015-07-13 DIAGNOSIS — A419 Sepsis, unspecified organism: Secondary | ICD-10-CM | POA: Diagnosis not present

## 2015-07-13 DIAGNOSIS — H9190 Unspecified hearing loss, unspecified ear: Secondary | ICD-10-CM

## 2015-07-13 LAB — CBC WITH DIFFERENTIAL/PLATELET
BASOS ABS: 0.1 10*3/uL (ref 0–0.1)
Eosinophils Absolute: 0.3 10*3/uL (ref 0–0.7)
Eosinophils Relative: 2 %
HEMATOCRIT: 41.9 % (ref 35.0–47.0)
Hemoglobin: 14.2 g/dL (ref 12.0–16.0)
Lymphs Abs: 2.1 10*3/uL (ref 1.0–3.6)
MCH: 30 pg (ref 26.0–34.0)
MCHC: 33.9 g/dL (ref 32.0–36.0)
MCV: 88.4 fL (ref 80.0–100.0)
Monocytes Absolute: 1 10*3/uL — ABNORMAL HIGH (ref 0.2–0.9)
Monocytes Relative: 8 %
NEUTROS ABS: 9.2 10*3/uL — AB (ref 1.4–6.5)
Platelets: 179 10*3/uL (ref 150–440)
RBC: 4.74 MIL/uL (ref 3.80–5.20)
RDW: 13.3 % (ref 11.5–14.5)
WBC: 12.6 10*3/uL — AB (ref 3.6–11.0)

## 2015-07-13 LAB — BASIC METABOLIC PANEL
ANION GAP: 9 (ref 5–15)
BUN: 9 mg/dL (ref 6–20)
CO2: 31 mmol/L (ref 22–32)
Calcium: 8.4 mg/dL — ABNORMAL LOW (ref 8.9–10.3)
Chloride: 98 mmol/L — ABNORMAL LOW (ref 101–111)
Creatinine, Ser: 0.48 mg/dL (ref 0.44–1.00)
GLUCOSE: 182 mg/dL — AB (ref 65–99)
POTASSIUM: 3.9 mmol/L (ref 3.5–5.1)
Sodium: 138 mmol/L (ref 135–145)

## 2015-07-13 LAB — GLUCOSE, CAPILLARY
GLUCOSE-CAPILLARY: 161 mg/dL — AB (ref 65–99)
Glucose-Capillary: 162 mg/dL — ABNORMAL HIGH (ref 65–99)

## 2015-07-13 MED ORDER — ONDANSETRON HCL 4 MG PO TABS: 4.0000 mg | ORAL_TABLET | Freq: Three times a day (TID) | ORAL | Status: AC | PRN

## 2015-07-13 MED ORDER — CIPROFLOXACIN HCL 500 MG PO TABS
500.0000 mg | ORAL_TABLET | Freq: Two times a day (BID) | ORAL | Status: DC
  Administered 2015-07-13: 500 mg via ORAL
  Filled 2015-07-13 (×3): qty 1

## 2015-07-13 MED ORDER — CIPROFLOXACIN HCL 500 MG PO TABS: 500.0000 mg | ORAL_TABLET | Freq: Two times a day (BID) | ORAL | Status: AC

## 2015-07-13 NOTE — Progress Notes (Signed)
Patient refusing lab draw this morning. CNA in shortly after lab to obtain VS, pt refused to have her BP taken. CNA states that the patient pulled the BP cuff off of her arm. Patient is upset and tired and states that she wants to go home.

## 2015-07-13 NOTE — Progress Notes (Signed)
Patient ID: Rachael Stokes, female   DOB: 1951-02-21, 64 y.o.   MRN: [id number] SUBJECTIVE:  Admitted with back pain, nausea; found to have right side pyelonephritis by CT.  Has chronic back pain due to lumbar DDD.  Had recently been on Bactrim for UTI; cx shows e coli with resistance to Bactrim and ampicillin.  Has been on zosyn; feels much better, with previous nausea better.  Ate well last PM.  Back pain much improved; feels back to her baseline level of discomfort.  Has sleep apnea, with nocturnal desats, but has not wanted to wear CPAP.  Sats 95% on RA this AM with pt awake.  ______________________________________________________________________  ROS: Please see HPI; remainder of complete 10 point ROS is negative   Past Medical History  Diagnosis Date  . Anemia   . Arthritis   . History of chicken pox   . Depression   . Diabetes mellitus without complication (HCC)   . GERD (gastroesophageal reflux disease)   . Allergy   . Hypertension   . History of blood transfusion   . History of urinary tract infection   . Sleep apnea   . Fatty liver     u/s 08/2013  . Chronic pain     Left hip since surgery 2003    Past Surgical History  Procedure Laterality Date  . Cesarean section    . Total hip arthroplasty Left 2003     Current facility-administered medications:  .  acetaminophen (TYLENOL) tablet 650 mg, 650 mg, Oral, Q6H PRN, 650 mg at 07/12/15 1658 **OR** acetaminophen (TYLENOL) suppository 650 mg, 650 mg, Rectal, Q6H PRN, Sital Mody, MD .  albuterol (PROVENTIL) (2.5 MG/3ML) 0.083% nebulizer solution 2.5 mg, 2.5 mg, Inhalation, Q6H PRN, Adrian SaranSital Mody, MD .  alum & mag hydroxide-simeth (MAALOX/MYLANTA) 200-200-20 MG/5ML suspension 30 mL, 30 mL, Oral, Q6H PRN, Adrian SaranSital Mody, MD .  aspirin EC tablet 81 mg, 81 mg, Oral, Daily, Adrian SaranSital Mody, MD, 81 mg at 07/12/15 0832 .  budesonide (PULMICORT) nebulizer solution 0.25 mg, 0.25 mg, Nebulization, BID, Marguarite ArbourJeffrey D Sparks, MD, 0.25 mg at  07/12/15 2028 .  enoxaparin (LOVENOX) injection 40 mg, 40 mg, Subcutaneous, Q24H, Sital Mody, MD, 40 mg at 07/12/15 1606 .  glipiZIDE (GLUCOTROL XL) 24 hr tablet 20 mg, 20 mg, Oral, Q breakfast, Sital Mody, MD, 20 mg at 07/12/15 [id number] .  hydrALAZINE (APRESOLINE) injection 10 mg, 10 mg, Intravenous, Q6H PRN, Adrian SaranSital Mody, MD .  HYDROcodone-acetaminophen (NORCO/VICODIN) 5-325 MG per tablet 1 tablet, 1 tablet, Oral, Q4H PRN, Adrian SaranSital Mody, MD, 1 tablet at 07/12/15 1244 .  Influenza vac split quadrivalent PF (FLUARIX) injection 0.5 mL, 0.5 mL, Intramuscular, Tomorrow-1000, Sital Mody, MD .  insulin aspart (novoLOG) injection 0-15 Units, 0-15 Units, Subcutaneous, TID WC, Adrian SaranSital Mody, MD, 2 Units at 07/12/15 1731 .  insulin aspart (novoLOG) injection 0-5 Units, 0-5 Units, Subcutaneous, QHS, Adrian SaranSital Mody, MD, 2 Units at 07/11/15 2202 .  ipratropium-albuterol (DUONEB) 0.5-2.5 (3) MG/3ML nebulizer solution 3 mL, 3 mL, Nebulization, QID, Marguarite ArbourJeffrey D Sparks, MD, 3 mL at 07/12/15 2028 .  linagliptin (TRADJENTA) tablet 5 mg, 5 mg, Oral, Daily, Adrian SaranSital Mody, MD, 5 mg at 07/12/15 0832 .  morphine 2 MG/ML injection 1 mg, 1 mg, Intravenous, Q4H PRN, Adrian SaranSital Mody, MD, 1 mg at 07/11/15 2051 .  ondansetron (ZOFRAN) tablet 4 mg, 4 mg, Oral, Q6H PRN, 4 mg at 07/11/15 1232 **OR** ondansetron (ZOFRAN) injection 4 mg, 4 mg, Intravenous, Q6H PRN, Adrian SaranSital Mody, MD, 4 mg at 07/12/15  1606 .  piperacillin-tazobactam (ZOSYN) IVPB 3.375 g, 3.375 g, Intravenous, 3 times per day, Marguarite Arbour, MD, 3.375 g at 07/13/15 0526 .  potassium chloride 20 MEQ/15ML (10%) solution 20 mEq, 20 mEq, Oral, BID, Marguarite Arbour, MD, 20 mEq at 07/12/15 0845 .  promethazine (PHENERGAN) tablet 25 mg, 25 mg, Oral, Q8H PRN, Adrian Saran, MD, 25 mg at 07/12/15 1659 .  senna-docusate (Senokot-S) tablet 1 tablet, 1 tablet, Oral, QHS PRN, Adrian Saran, MD  PHYSICAL EXAM:  BP 118/94 mmHg  Pulse 84  Temp(Src) 98.6 F (37 C) (Oral)  Resp 18  Ht  (1.676 m)  Wt  111.766 kg (246 lb 6.4 oz)  BMI 39.79 kg/m2  SpO2 97%  General: obese  female, in NAD HEENT: PERRL; OP moist without lesions. Neck: supple, trachea midline, no thyromegaly Chest: normal to palpation Lungs: clear bilaterally without retractions or wheezes Cardiovascular: RRR, no murmur, no gallop; distal pulses 2+ Abdomen: soft, nontender.  Slight distension; positive bowel sounds Extremities: no clubbing, cyanosis, edema Neuro: Deaf; other CN appear intact; reads lips; does not do sign language well. moves all extremities Derm: no significant rashes or nodules; good skin turgor Lymph: no cervical or supraclavicular lymphadenopathy  Labs and imaging studies were reviewed  ASSESSMENT/PLAN:   1. Pyelonephritis due to E Coli.  Change to cipro (sensitive by resistance pattern).  Encourage PO and fluid intake.   2. OSA- emphasized need to use CPAP.  sats normal this AM 3. DM, uncontrolled.  Has been a struggle as outpt as well.  Cont meds and cover with SSI 4. Hypokalemia- resolved 5. Lumbar DDD- pain seems improved.  Question from PT eval as to whether she needs walker; will see how she does with PT later this AM.   6. D/c plan- will reevaluate at lunch hr; if does well with eating, ambulation, should be able to go home.

## 2015-07-13 NOTE — Discharge Summary (Signed)
Physician Discharge Summary  Patient ID: Rachael Stokes MRN: [id number] DOB/AGE: Jan 26, 1951 64 y.o.  Admit date: 07/10/2015 Discharge date: 07/13/2015  Admission Diagnoses:  Discharge Diagnoses:  Principal Problem:   Pyelonephritis Active Problems:   Diabetes mellitus type 2, uncontrolled (HCC)   Chronic pain   Deafness   Discharged Condition: stable  Hospital Course: Admitted with nausea, back pain, and finding of acute pyelonephritis on CT scan.  Urine culture with E coli, resistant to Bactrim and ampicillin. Placed on rocephin initially, then changed to zosyn, with steady improvement and resolution of back pain, nausea (other than her chronic back pain).  Changed to cipro PO, tolerated that well  Ambulated with PT, who recommended walker and HH PT; pt feels comfortable with a cane and does not want PT and is aware of risk/recommendation  Oxygen saturation low at night on RA; pt with sleep apnea but did not feel comfortable with CPAP.  Sats normalized during the day (97% on RA today).  Needs to wear CPAP whenever sleeping  FSBS remained elevated throughout hospitalization, with improvement as infection improved.  Restart home meds, watch diet, and recommended she record sugars.  F/u in 1 week to reassess and can discuss FSBS at that time  D/c to home in stable condition; up with cane as tolerated.  Follow low fat, carbohydrate controlled diet.    Discharge Exam: Blood pressure 118/94, pulse 84, temperature 98.6 F (37 C), temperature source Oral, resp. rate 18, height  (1.676 m), weight 111.766 kg (246 lb 6.4 oz), SpO2 97 %.   Disposition: Home      Discharge Instructions    Diet - low sodium heart healthy    Complete by:  As directed      Discharge instructions    Complete by:  As directed   Drink plenty of fluids.  Check your sugar daily and record this     Increase activity slowly    Complete by:  As directed             Medication List    STOP  taking these medications        diclofenac 50 MG EC tablet  Commonly known as:  VOLTAREN     hydrochlorothiazide 25 MG tablet  Commonly known as:  HYDRODIURIL      TAKE these medications        albuterol 108 (90 BASE) MCG/ACT inhaler  Commonly known as:  PROVENTIL HFA;VENTOLIN HFA  Inhale 2 puffs into the lungs every 6 (six) hours as needed for wheezing or shortness of breath.     aspirin EC 81 MG tablet  Take 81 mg by mouth daily.     ciprofloxacin 500 MG tablet  Commonly known as:  CIPRO  Take 1 tablet (500 mg total) by mouth 2 (two) times daily. X 7 days     EPIPEN 2-PAK 0.3 mg/0.3 mL Soaj injection  Generic drug:  EPINEPHrine  Inject 0.3 mg into the muscle once as needed (for severe allergic reaction).     fenofibrate 145 MG tablet  Commonly known as:  TRICOR  Take 145 mg by mouth daily.     fluticasone 50 MCG/ACT nasal spray  Commonly known as:  FLONASE  Place 2 sprays into both nostrils daily.     glipiZIDE 10 MG 24 hr tablet  Commonly known as:  GLUCOTROL XL  Take 20 mg by mouth daily.     HYDROcodone-acetaminophen 5-325 MG tablet  Commonly known as:  NORCO/VICODIN  Take 1 tablet by mouth every 8 (eight) hours as needed for moderate pain.     linagliptin 5 MG Tabs tablet  Commonly known as:  TRADJENTA  Take 1 tablet (5 mg total) by mouth daily.     ondansetron 4 MG tablet  Commonly known as:  ZOFRAN  Take 1 tablet (4 mg total) by mouth every 8 (eight) hours as needed for nausea.     VICTOZA 18 MG/3ML Sopn  Generic drug:  Liraglutide  Inject 1.2 mg into the skin daily.       Follow-up Information    Follow up with BERT Maryjean KaJ KLEIN III, MD In 1 week.   Specialty:  Internal Medicine   Contact information:   [address] [facility] [place] [address] [phone]       Signed: Curtis SitesBERT J KLEIN III 07/13/2015, 12:53 PM

## 2015-07-13 NOTE — Plan of Care (Signed)
Problem: Discharge Progression Outcomes Goal: Other Discharge Outcomes/Goals Outcome: Progressing Plan of care progress to goals: Discharge plan in place- pt is from home and would like to be discharged back to home when stable. Pain- no c/o pain this shift. Hemodynamically stable- afebrile this shift, otherwise VSS, WBC- 9.2 pt is on IV antibiotics, continue to monitor labs.   Complications controlled- afebrile this shift. O2 sats down to mid 80's on 2 liter/min, RT increased oxygen to 5 liters/min, sats up to 99%. Oxygen reduced to 4 liters/min, O2 sats remain greater than 92%.    Diet-pt states that she feels better and that she ate well today. Pt did c/o nausea later in the shift but declines any intervention, only wanted to lay still and stated that it would pass, no further complaints noted.  Activity appropriate for discharge- High fall risk with bed alarm activated. Pt is a 1 assist up to [facility]. Pt states that she is deaf but able to read lips, speak clear and face patient.

## 2015-07-13 NOTE — Care Management Important Message (Signed)
Important Message  Patient Details  Name: Rachael Stokes MRN: [id number] Date of Birth: [birth year]   Medicare Important Message Given:  Yes-third notification given    Gwenette GreetBrenda S Janika Jedlicka, RN 07/13/2015, 12:01 PM

## 2015-07-13 NOTE — Discharge Instructions (Signed)

## 2015-07-13 NOTE — Progress Notes (Signed)
Patient being discharged home on PO antibiotics via personal vehicle per her Son.

## 2015-07-15 LAB — CULTURE, BLOOD (ROUTINE X 2)
Culture: NO GROWTH
Culture: NO GROWTH

## 2017-03-14 ENCOUNTER — Other Ambulatory Visit: Payer: Self-pay | Admitting: Internal Medicine

## 2017-03-14 DIAGNOSIS — M47816 Spondylosis without myelopathy or radiculopathy, lumbar region: Secondary | ICD-10-CM

## 2017-03-14 DIAGNOSIS — M5136 Other intervertebral disc degeneration, lumbar region: Secondary | ICD-10-CM

## 2017-03-22 ENCOUNTER — Ambulatory Visit
Admission: RE | Admit: 2017-03-22 | Discharge: 2017-03-22 | Disposition: A | Discharge status: Home / Self Care | Payer: Medicare PPO | Source: Ambulatory Visit | Attending: Internal Medicine | Admitting: Internal Medicine

## 2017-03-22 DIAGNOSIS — M5136 Other intervertebral disc degeneration, lumbar region: Secondary | ICD-10-CM | POA: Insufficient documentation

## 2017-03-22 DIAGNOSIS — M48061 Spinal stenosis, lumbar region without neurogenic claudication: Secondary | ICD-10-CM | POA: Insufficient documentation

## 2017-03-22 DIAGNOSIS — M4696 Unspecified inflammatory spondylopathy, lumbar region: Secondary | ICD-10-CM | POA: Diagnosis present

## 2017-03-22 DIAGNOSIS — M47816 Spondylosis without myelopathy or radiculopathy, lumbar region: Secondary | ICD-10-CM | POA: Insufficient documentation

## 2023-10-28 DEATH — deceased
